# Patient Record
Sex: Male | Born: 1960 | Race: Black or African American | Hispanic: No | Marital: Married | Smoking: Never smoker
Health system: Southern US, Community
[De-identification: ages and names within clinical notes are randomized; demographics above are authoritative.]

## PROBLEM LIST (undated history)

## (undated) DIAGNOSIS — E119 Type 2 diabetes mellitus without complications: Secondary | ICD-10-CM

## (undated) HISTORY — PX: NO PAST SURGERIES: SHX2092

---

## 2006-07-05 ENCOUNTER — Other Ambulatory Visit: Payer: Self-pay

## 2006-07-21 ENCOUNTER — Ambulatory Visit: Payer: Self-pay | Admitting: Surgery

## 2007-03-27 ENCOUNTER — Ambulatory Visit: Payer: Self-pay | Admitting: Gastroenterology

## 2009-05-06 ENCOUNTER — Ambulatory Visit: Payer: Self-pay | Admitting: Family Medicine

## 2011-02-04 IMAGING — US US PELVIS LIMITED
1 series · 17 of 25 positions shown · non-contrast
Comparison: none

REASON FOR EXAM: testicular lump
COMMENTS:

[Series 1: us pelvis limited · 17 of 51 slices shown]
[im 1/51]
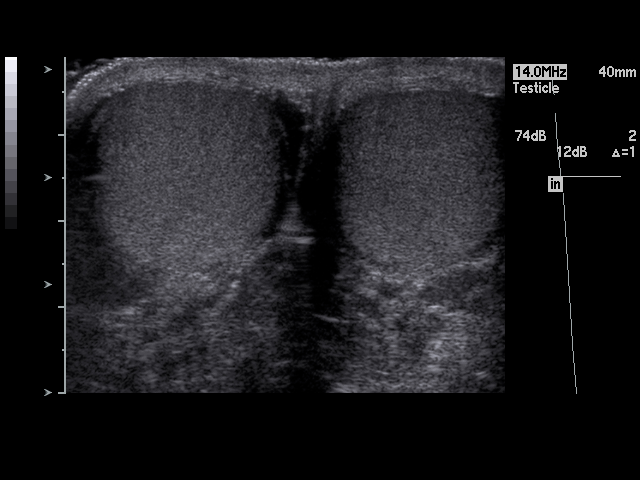
[im 5/51]
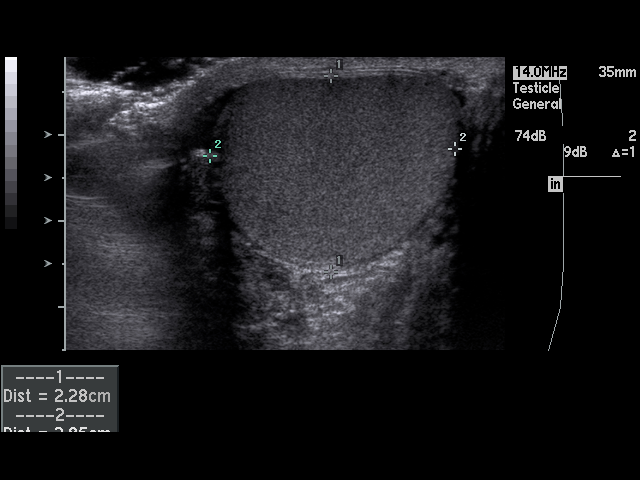
[im 7/51]
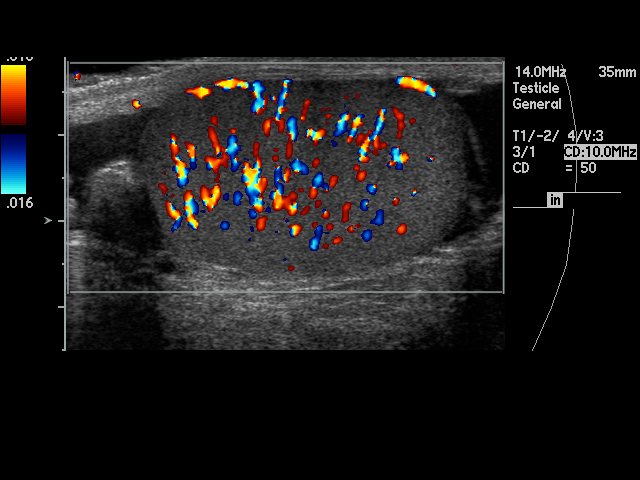
[im 11/51]
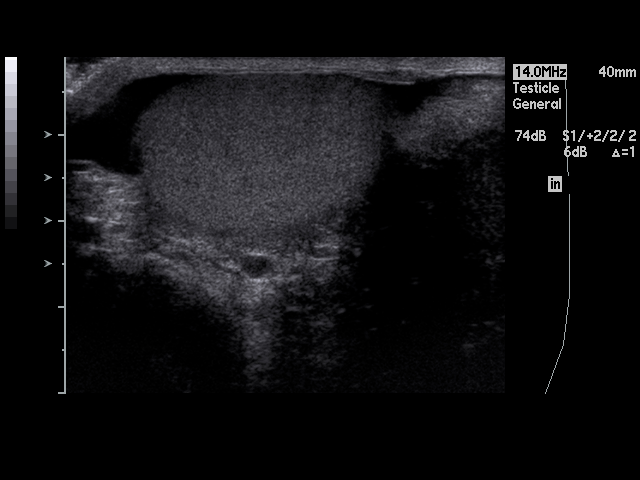
[im 13/51]
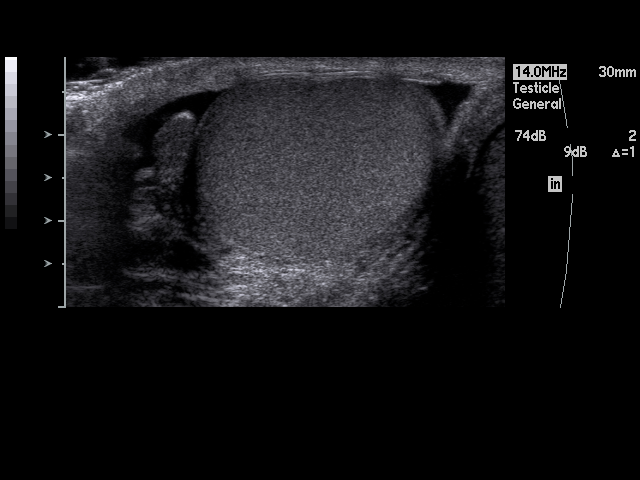
[im 17/51]
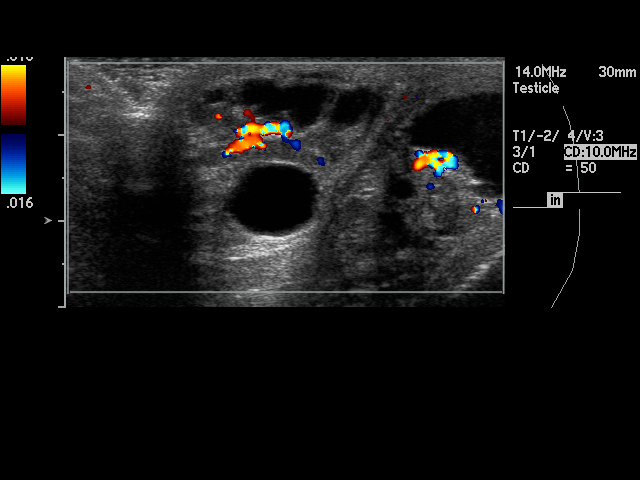
[im 19/51]
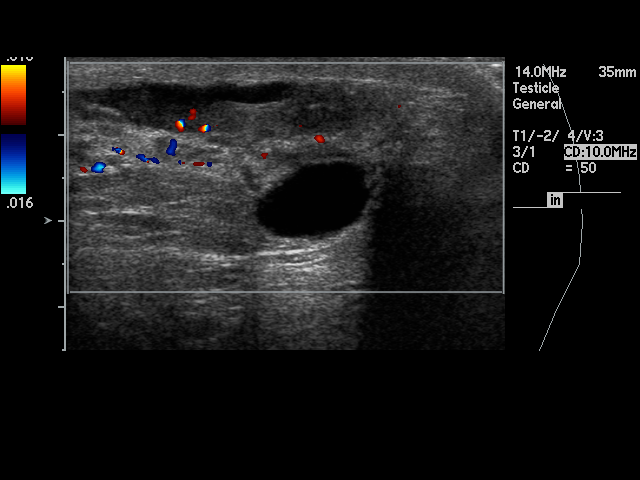
[im 23/51]
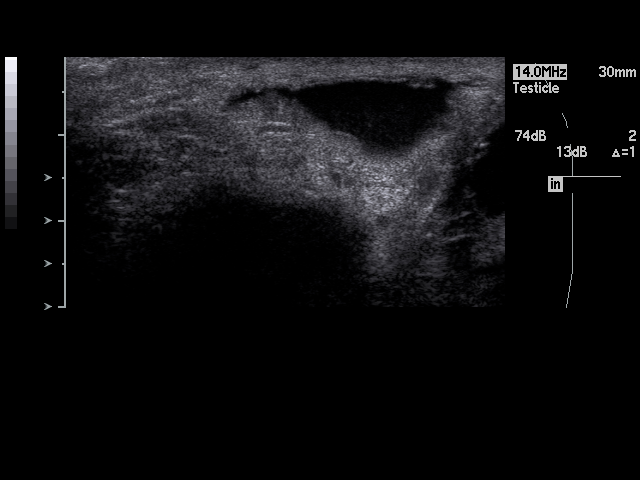
[im 26/51]
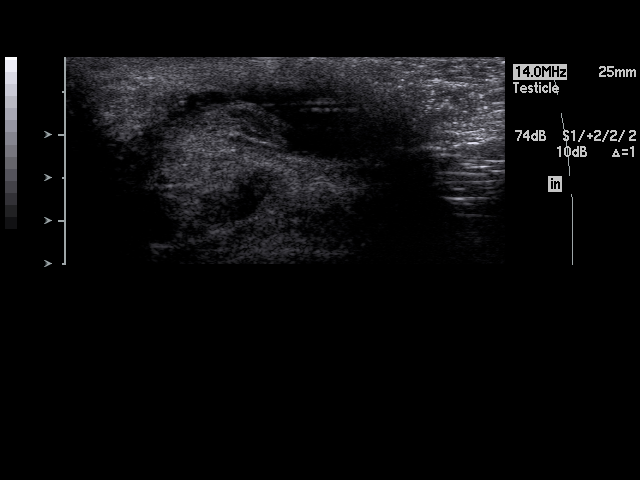
[im 28/51]
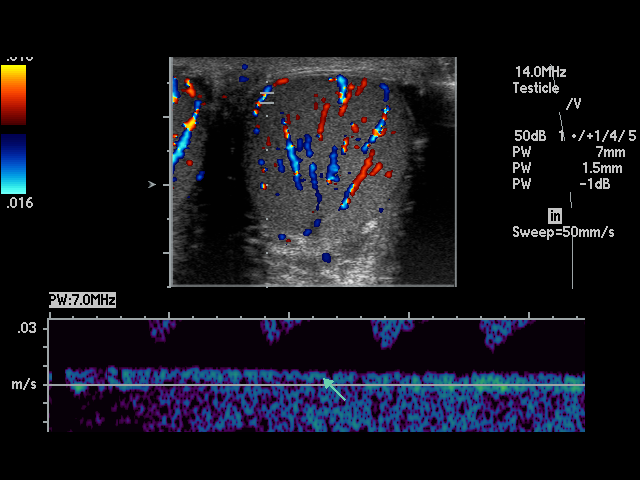
[im 32/51]
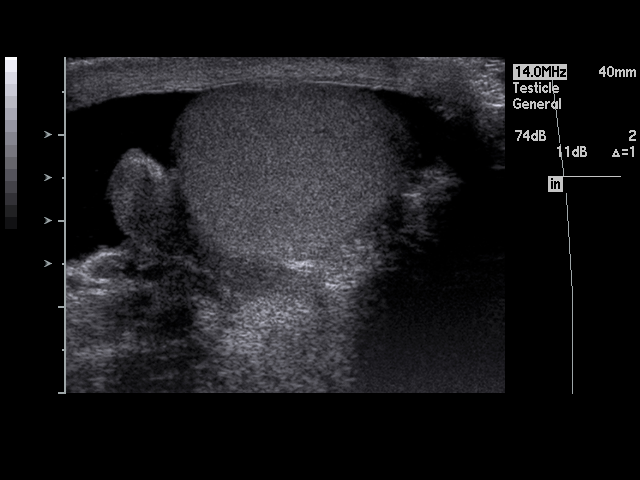
[im 34/51]
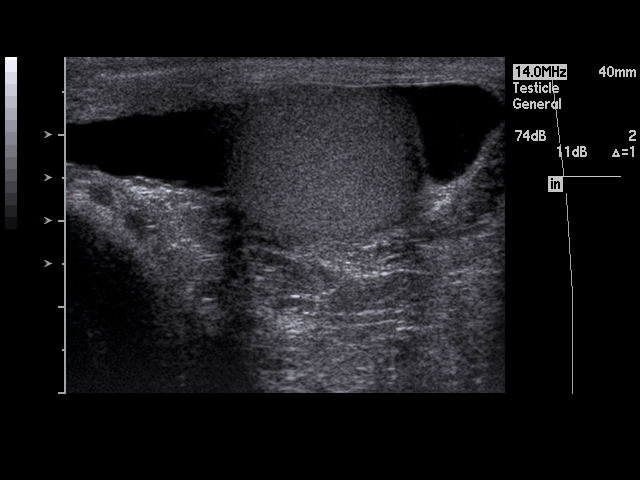
[im 38/51]
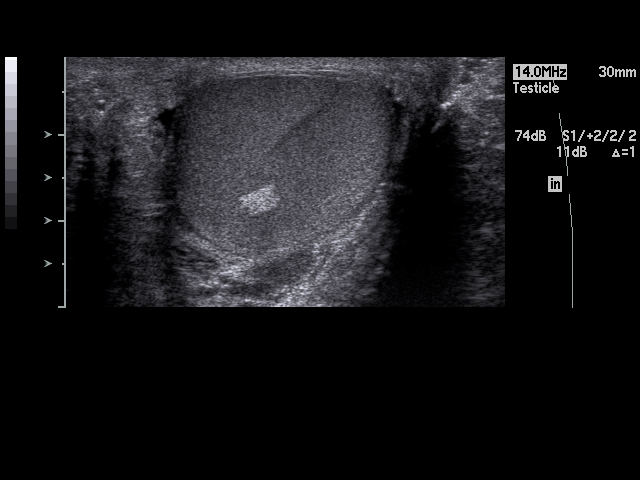
[im 40/51]
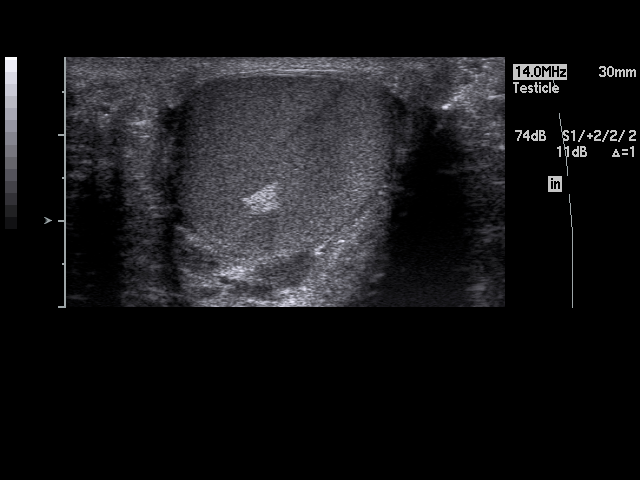
[im 44/51]
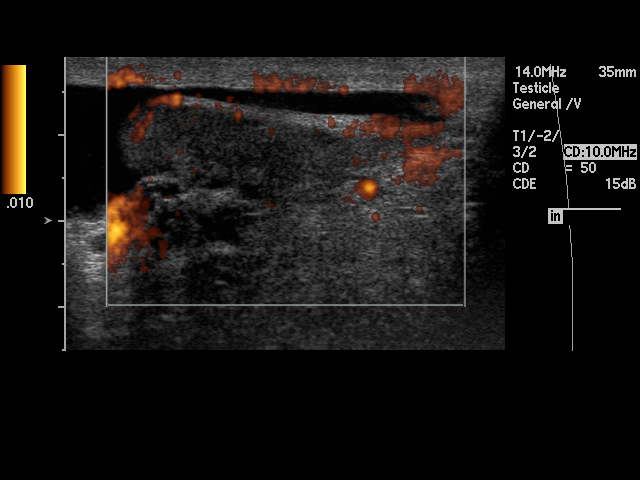
[im 46/51]
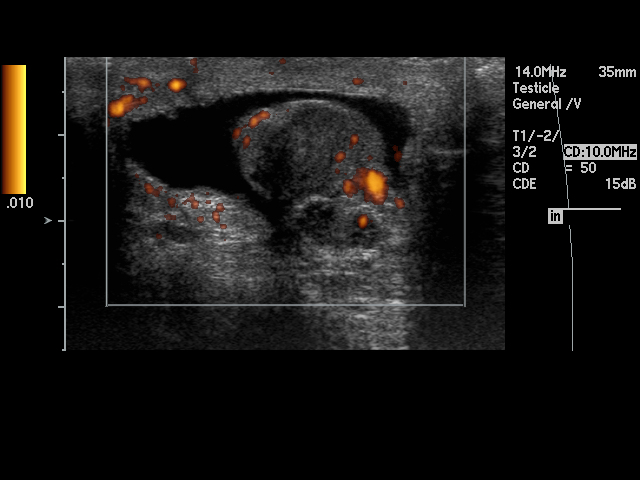
[im 51/51]
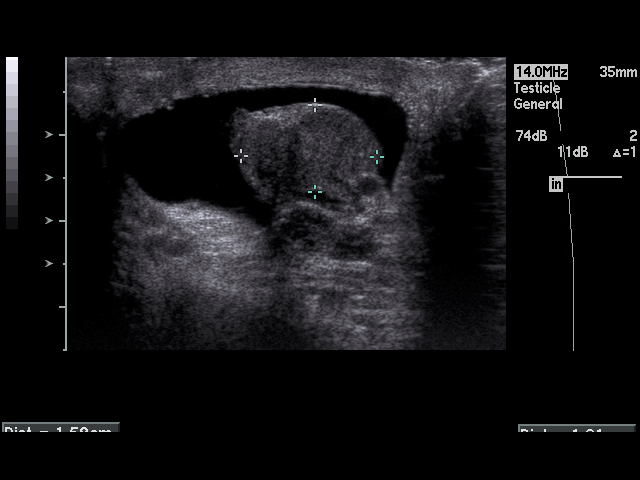

[17 of 25 positions shown; findings below may reference images not displayed]

PROCEDURE:     WEEKS - WEEKS TESTICULAR  - May 06, 2009 [DATE]

RESULT:     The right testicle measures 3.87 cm x 2.28 x 2.85 cm. The left
testicle measures 3.49 cm x 2.42 cm x 2.52 cm. Doppler examination shows
symmetrical vascular flow in each testicle. There is no evidence for
torsion. There is a 4.1 mm, hyperechoic focus in the left testicle. This is
associated with attenuation of the echo beam and is consistent with
calcification. No associated solid mass component is seen. This may
represent a calcified hemangioma, hematoma or granuloma. On the right, where
the patient is symptomatic, there is identified no intratesticular mass. On
the right, there is an extratesticular cyst that measures 1.35 cm at maximum
diameter. Small hydroceles are seen bilaterally with that on the left being
larger.
IMPRESSION: 1.  There is a nonspecific focal calcification in the left testicle.
2.  There is an extratesticular cyst on the right. The cyst measures 1.35 cm
at maximum diameter.
3.  Bilateral hydroceles are seen with that on the left being larger.
4.  No torsion is evident.

## 2011-07-30 ENCOUNTER — Emergency Department: Payer: Self-pay | Admitting: Emergency Medicine

## 2011-09-14 ENCOUNTER — Ambulatory Visit: Payer: Self-pay | Admitting: Family Medicine

## 2012-03-21 ENCOUNTER — Ambulatory Visit: Payer: Self-pay | Admitting: Internal Medicine

## 2012-04-04 ENCOUNTER — Ambulatory Visit: Payer: Self-pay | Admitting: Family Medicine

## 2012-04-18 ENCOUNTER — Ambulatory Visit: Payer: Self-pay | Admitting: Family Medicine

## 2013-10-25 DIAGNOSIS — J45909 Unspecified asthma, uncomplicated: Secondary | ICD-10-CM | POA: Insufficient documentation

## 2013-10-25 DIAGNOSIS — J309 Allergic rhinitis, unspecified: Secondary | ICD-10-CM | POA: Insufficient documentation

## 2013-10-25 DIAGNOSIS — E7800 Pure hypercholesterolemia, unspecified: Secondary | ICD-10-CM | POA: Insufficient documentation

## 2013-10-25 DIAGNOSIS — I10 Essential (primary) hypertension: Secondary | ICD-10-CM | POA: Diagnosis present

## 2013-10-25 DIAGNOSIS — A6000 Herpesviral infection of urogenital system, unspecified: Secondary | ICD-10-CM | POA: Insufficient documentation

## 2015-10-21 DIAGNOSIS — G4733 Obstructive sleep apnea (adult) (pediatric): Secondary | ICD-10-CM | POA: Diagnosis present

## 2015-10-21 DIAGNOSIS — D649 Anemia, unspecified: Secondary | ICD-10-CM | POA: Insufficient documentation

## 2016-05-30 DIAGNOSIS — D709 Neutropenia, unspecified: Secondary | ICD-10-CM | POA: Insufficient documentation

## 2017-05-10 ENCOUNTER — Ambulatory Visit (INDEPENDENT_AMBULATORY_CARE_PROVIDER_SITE_OTHER): Payer: BLUE CROSS/BLUE SHIELD

## 2017-05-10 ENCOUNTER — Other Ambulatory Visit: Payer: Self-pay | Admitting: Podiatry

## 2017-05-10 ENCOUNTER — Encounter: Payer: Self-pay | Admitting: Podiatry

## 2017-05-10 ENCOUNTER — Ambulatory Visit (INDEPENDENT_AMBULATORY_CARE_PROVIDER_SITE_OTHER): Payer: BLUE CROSS/BLUE SHIELD | Admitting: Podiatry

## 2017-05-10 VITALS — BP 138/82 | HR 73 | Temp 98.1°F | Resp 16

## 2017-05-10 DIAGNOSIS — M7751 Other enthesopathy of right foot: Secondary | ICD-10-CM

## 2017-05-10 DIAGNOSIS — S99921A Unspecified injury of right foot, initial encounter: Secondary | ICD-10-CM

## 2017-05-10 MED ORDER — DICLOFENAC SODIUM 75 MG PO TBEC: 75.0000 mg | DELAYED_RELEASE_TABLET | Freq: Two times a day (BID) | ORAL | 0 refills | Status: DC

## 2017-05-10 NOTE — Progress Notes (Signed)
   Subjective:    Patient ID: Jake Leonard, male    DOB: 06/01/1961, 56 y.o.   MRN: [id number]  HPI    Review of Systems  Eyes: Positive for itching.  Genitourinary: Positive for frequency.  All other systems reviewed and are negative.      Objective:   Physical Exam        Assessment & Plan:

## 2017-05-12 NOTE — Progress Notes (Signed)
   HPI: 56 year old male presenting today as a new patient with a complaint of sharp, shooting pain throughout the right great toe, foot and up the RLE that began approximately one week ago. He states he stubbed the toe on concrete at that time. He reports associated swelling. He has been massaging the area and soaking it in Epsom salts. He is here for further evaluation and treatment.   No past medical history on file.   Physical Exam: General: The patient is alert and oriented x3 in no acute distress.  Dermatology: Skin is warm, dry and supple bilateral lower extremities. Negative for open lesions or macerations.  Vascular: Palpable pedal pulses bilaterally. No edema or erythema noted. Capillary refill within normal limits.  Neurological: Epicritic and protective threshold grossly intact bilaterally.   Musculoskeletal Exam: Pain with palpation to the first MPJ of the right foot. Range of motion within normal limits to all pedal and ankle joints bilateral. Muscle strength 5/5 in all groups bilateral.   Radiographic Exam:  Normal osseous mineralization. Joint spaces preserved. No fracture/dislocation/boney destruction.    Assessment: - First MPJ capsulitis   Plan of Care:  - Patient evaluated. X-rays reviewed. - Prescription for diclofenac 75 mg given to patient. - Recommended good shoe gear. - Return to clinic when necessary.   Felecia ShellingBrent M. Evans, DPM [facility]  Dr. Felecia ShellingBrent M. Evans, DPM    [address]                                        [place], [place] [address]                Office [phone]  Fax [phone]

## 2018-07-18 DIAGNOSIS — E1165 Type 2 diabetes mellitus with hyperglycemia: Secondary | ICD-10-CM | POA: Diagnosis present

## 2019-03-17 ENCOUNTER — Other Ambulatory Visit: Payer: Self-pay

## 2019-03-17 ENCOUNTER — Ambulatory Visit
Admission: EM | Admit: 2019-03-17 | Discharge: 2019-03-17 | Disposition: A | Discharge status: Home / Self Care | Payer: BC Managed Care – PPO | Attending: Urgent Care | Admitting: Urgent Care

## 2019-03-17 ENCOUNTER — Ambulatory Visit (INDEPENDENT_AMBULATORY_CARE_PROVIDER_SITE_OTHER): Payer: BC Managed Care – PPO

## 2019-03-17 ENCOUNTER — Encounter: Payer: Self-pay | Admitting: Emergency Medicine

## 2019-03-17 DIAGNOSIS — Z6379 Other stressful life events affecting family and household: Secondary | ICD-10-CM

## 2019-03-17 DIAGNOSIS — R7989 Other specified abnormal findings of blood chemistry: Secondary | ICD-10-CM

## 2019-03-17 DIAGNOSIS — R079 Chest pain, unspecified: Secondary | ICD-10-CM

## 2019-03-17 DIAGNOSIS — Z634 Disappearance and death of family member: Secondary | ICD-10-CM | POA: Diagnosis not present

## 2019-03-17 DIAGNOSIS — R739 Hyperglycemia, unspecified: Secondary | ICD-10-CM | POA: Diagnosis not present

## 2019-03-17 HISTORY — DX: Type 2 diabetes mellitus without complications: E11.9

## 2019-03-17 LAB — CBC
HCT: 40.6 % (ref 39.0–52.0)
Hemoglobin: 13.7 g/dL (ref 13.0–17.0)
MCH: 28.7 pg (ref 26.0–34.0)
MCHC: 33.7 g/dL (ref 30.0–36.0)
MCV: 85.1 fL (ref 80.0–100.0)
Platelets: 280 10*3/uL (ref 150–400)
RBC: 4.77 MIL/uL (ref 4.22–5.81)
RDW: 13.3 % (ref 11.5–15.5)
WBC: 4.8 10*3/uL (ref 4.0–10.5)
nRBC: 0 % (ref 0.0–0.2)

## 2019-03-17 LAB — BASIC METABOLIC PANEL
Anion gap: 11 (ref 5–15)
BUN: 25 mg/dL — ABNORMAL HIGH (ref 6–20)
CO2: 20 mmol/L — ABNORMAL LOW (ref 22–32)
Calcium: 9 mg/dL (ref 8.9–10.3)
Chloride: 103 mmol/L (ref 98–111)
Creatinine, Ser: 1.41 mg/dL — ABNORMAL HIGH (ref 0.61–1.24)
GFR calc Af Amer: >60 mL/min (ref >60)
GFR calc non Af Amer: 55 mL/min — ABNORMAL LOW (ref >60)
Glucose, Bld: 276 mg/dL — ABNORMAL HIGH (ref 70–99)
Potassium: 4.2 mmol/L (ref 3.5–5.1)
Sodium: 134 mmol/L — ABNORMAL LOW (ref 135–145)

## 2019-03-17 LAB — TROPONIN I (HIGH SENSITIVITY): Troponin I (High Sensitivity): 4 ng/L (ref <18)

## 2019-03-17 NOTE — ED Triage Notes (Signed)
Pt c/o left sided chest pain. Pain radiated to his back. Started today.Denies shortness of breath, sweats, or nausea. Denies family or personal history of heart disease.

## 2019-03-17 NOTE — Discharge Instructions (Addendum)
It was very nice seeing you today in clinic. Thank you for entrusting me with your care.   Your heart labs and chest film were normal. Your renal function was elevated, but within your baseline. Your sugar was elevated at 276. Suspect that this pain is musculoskeletal in nature since it is improving with the Tylenol, however  cannot rule out a heart issue in the urgent care setting. I would recommended you being seen in the ED for worsening pain, or if you develop shortness of breath, nausea, sweating, or any other concerning symptom.   Make arrangements to follow up with your regular doctor in 1 week for re-evaluation if not improving. Again, if your symptoms/condition worsens, please seek follow up care either here or in the ER. Please remember, our [facility] providers are "right here with you" when you need [place].   Again, it was my pleasure to take care of you today. Thank you for choosing our clinic. I hope that you start to feel better quickly.   Honor Loh, MSN, APRN, FNP-C, CEN Advanced Practice Provider [facility]

## 2019-03-17 NOTE — ED Provider Notes (Signed)
[place], [place]   Name: Jake Leonard DOB: 02/19/1961 MRN: [id number] CSN: [id number] PCP: [facility]  Arrival date and time:  03/17/19 1132  Chief Complaint:  Chest Pain   NOTE: Prior to seeing the patient today, I have reviewed the triage nursing documentation and vital signs. Clinical staff has updated patient's PMH/PSHx, current medication list, and drug allergies/intolerances to ensure comprehensive history available to assist in medical decision making.   History:   HPI: Jake Leonard is a 58 y.o. male who presents today with complaints of LEFT sided chest pain that began with acute onset earlier today. Patient advising that he has been under a great deal of stress due to the loss of his mother this week. Patient describes the pain as having a sharp quality. Patient denies any blunt force trauma to his anterior or lateral chest wall. He notes that the pain radiates intermittently into his back, but not into the shoulder, neck, or jaw. Patient is not having any pain in his LEFT upper extremity. He denies associated shortness of breath, nausea, vomiting, and diaphoresis. Patient does not have a history of gastrointestinal reflux. Prior to the onset of his symptoms, patient notes that he was "not really doing anything". Pain is reproducible with movement, but not with palpation or deep inspiration. Patient indicates that the pain is improved by rest. Patient advising that he has experienced similar episodes of pain in his chest in the past. Patient denies a past medical history significant for any known cardiac problems. Patient does not have a history of any sort of cardiac arrhythmias; no pacemaker or AICD. He notes that his family history is also negative for MI, CAD, heart failure, VTE, and sudden cardiac death. He has never been diagnosed with a DVT or PE in the past. He is not on daily anticoagulation therapy. Patient does not take aspirin on a daily basis.    Past Medical  History:  Diagnosis Date   Diabetes mellitus without complication (HCC)     Past Surgical History:  Procedure Laterality Date   NO PAST SURGERIES      Family History  Problem Relation Age of Onset   Alzheimer's disease Mother    Alcohol abuse Father     Social History   Tobacco Use   Smoking status: Never Smoker   Smokeless tobacco: Never Used  Substance Use Topics   Alcohol use: Yes    Comment: occasionally   Drug use: No    There are no active problems to display for this patient.   Home Medications:    Current Meds  Medication Sig   glipiZIDE (GLUCOTROL) 10 MG tablet Take 10 mg by mouth daily before breakfast.   metFORMIN (GLUCOPHAGE) 1000 MG tablet Take 1,000 mg by mouth 2 (two) times daily with a meal.   simvastatin (ZOCOR) 20 MG tablet Take 20 mg by mouth daily.    Allergies:   Patient has no known allergies.  Review of Systems (ROS): Review of Systems  Constitutional: Negative for chills and fever.  Respiratory: Negative for cough and shortness of breath.   Cardiovascular: Positive for chest pain. Negative for palpitations.  Gastrointestinal: Negative for abdominal pain, diarrhea, nausea and vomiting.  Musculoskeletal: Positive for back pain.  Neurological: Negative for dizziness, syncope, weakness, numbness and headaches.  Psychiatric/Behavioral:       Increased stress related to loss of mother this week  All other systems reviewed and are negative.    Vital Signs: Today's Vitals  03/17/19 1142 03/17/19 1143  BP:  135/81  Pulse:  81  Resp:  18  Temp:  98.3 F (36.8 C)  TempSrc:  Oral  SpO2:  100%  Weight:  215 lb (97.5 kg)  Height:  6' (1.829 m)  PainSc: 9      Physical Exam: Physical Exam  Constitutional: He is oriented to person, place, and time and well-developed, well-nourished, and in no distress. No distress.  HENT:  Head: Normocephalic and atraumatic.  Nose: Nose normal.  Mouth/Throat: Oropharynx is clear and  moist.  Eyes: Pupils are equal, round, and reactive to light. Conjunctivae and EOM are normal.  Neck: Normal range of motion. Neck supple. No tracheal deviation present.  Cardiovascular: Normal rate, regular rhythm, normal heart sounds and intact distal pulses. Exam reveals no gallop and no friction rub.  No murmur heard. Pulmonary/Chest: Effort normal and breath sounds normal. No respiratory distress. He has no wheezes. He has no rales. He exhibits no tenderness.  Abdominal: Soft. Bowel sounds are normal. He exhibits no distension. There is no abdominal tenderness.  Musculoskeletal: Normal range of motion.  Lymphadenopathy:    He has no cervical adenopathy.  Neurological: He is alert and oriented to person, place, and time. Gait normal. GCS score is 15.  Skin: Skin is warm and dry. No rash noted. He is not diaphoretic.  Psychiatric: Mood, memory and judgment normal. He has a flat affect.  Nursing note and vitals reviewed.   Urgent Care Treatments / Results:   LABS: PLEASE NOTE: all labs that were ordered this encounter are listed, however only abnormal results are displayed. Labs Reviewed  BASIC METABOLIC PANEL - Abnormal; Notable for the following components:      Result Value   Sodium 134 (*)    CO2 20 (*)    Glucose, Bld 276 (*)    BUN 25 (*)    Creatinine, Ser 1.41 (*)    GFR calc non Af Amer 55 (*)    All other components within normal limits  CBC  TROPONIN I (HIGH SENSITIVITY)    URGENT CARE ECG REPORT Date: 03/17/2019 Time ECG obtained: 1146 AM Rate: 77 bpm Rhythm: normal sinus rhythm Axis (leads I and aVF): normal Intervals: normal ST segment and T wave changes: Non-specific ST wave abnormality in V2-V3 Comparison: Similar to previous tracing obtained on 07/05/2006  RADIOLOGY: Dg Chest 2 View  Result Date: 03/17/2019 CLINICAL DATA:  Left chest pain onset today extending into the left upper back. EXAM: CHEST - 2 VIEW COMPARISON:  Report from chest radiograph from  07/25/1998 firm 07/05/2006 FINDINGS: The lungs appear clear.  Cardiac and mediastinal contours normal. No pleural effusion identified. Mild thoracic spondylosis. IMPRESSION: No active cardiopulmonary disease. Electronically Signed   By: Gaylyn RongWalter  Liebkemann M.D.   On: 03/17/2019 12:24    PROCEDURES: Procedures  MEDICATIONS RECEIVED THIS VISIT: Medications - No data to display  PERTINENT CLINICAL COURSE NOTES/UPDATES:   Initial Impression / Assessment and Plan / Urgent Care Course:  Pertinent labs & imaging results that were available during my care of the patient were personally reviewed by me and considered in my medical decision making (see lab/imaging section of note for values and interpretations).  Jake Leonard is a 58 y.o. male who [facility] with complaints of Chest Pain   Patient is well appearing overall in clinic today. He does not appear to be in any acute distress. Presenting symptoms (see HPI) and exam as documented above. EKG  shows NSR at a rate of 77 bpm. There were non-specific ST changes in V2-V3; [facility] provider Gwinda Passe(Padachowski, MD); EKG similar to previous tracings. PERC (-).  Patient denies nausea, vomiting, diaphoresis, and associated shortness of breath. CBC unremarkable.  Glucose elevated at 276 mg/dL.  There was a slight degree of renal insufficiency noted;  BUN 25 and creatinine 1.41 mg/dL (baseline 1.2 -1.6 mg/dL). Hs-TnI normal. CXR unremarkable.   Patient under a great deal of stress related to the recent loss of his mother this past week. He seems to be coping effectively. Patient with nonspecific pain in his left chest with intermittent radiation to his back. Pain in his chest today could potentially be related, however with the improvement noted with APAP that was taken PTA, there is an undoubtable musculoskeletal component.  Patient advised that ACS could not be fully ruled out in urgent care setting.  He was encouraged to  present to the emergency department for further evaluation, however patient advising that he was feeling better after the APAP that he has taken. Patient declining to go to the emergency department at this time. He understands that any degree of worsening pain, or the development of any shortness of breath, nausea, or diaphoresis will necessitate him being seen for further evaluation.   Discussed follow up with primary care physician for re-evaluation to discuss chest pain episode, anxiety/stress/grief, hyperglycemia, and to follow up on renal function. I have reviewed the follow up and strict return precautions for any new or worsening symptoms. Patient is aware of symptoms that would be deemed urgent/emergent, and would thus require further evaluation either here or in the emergency department. At the time of discharge, he verbalized understanding and consent with the discharge plan as it was reviewed with him. All questions were fielded by provider and/or clinic staff prior to patient discharge.    ADDENDUM: Call placed to patient at 6:28 PM on 03/17/19. I spoke directly with Jake Leonard to reassess clinical condition. Patient advising that he has felt fine since being seen in clinic earlier today.  He denies any recurrent episodes of chest pain.  He has not experienced any shortness of breath, palpitations, nausea, or episodes of diaphoresis.  Reassurance provided.  Patient encouraged to present to the emergency department for further evaluation as previously discussed for any concerning symptoms. He verbalized understanding and consent. Patient very appreciative of follow up call from clinical provider.   Final Clinical Impressions / Urgent Care Diagnoses:   Final diagnoses:  Nonspecific chest pain  Elevated serum creatinine  Hyperglycemia  Death of family member  Stress due to illness of family member    New Prescriptions:  [place] Controlled Substance Registry consulted? Not Applicable  No  orders of the defined types were placed in this encounter.   Recommended Follow up Care:  Patient encouraged to follow up with the following provider within the specified time frame, or sooner as dictated by the severity of his symptoms. As always, he was instructed that for any urgent/emergent care needs, he should seek care either here or in the emergency department for more immediate evaluation.  Follow-up Information    Go to  [facility] EMER[facility]ENCY DEPARTMENT.   Specialty: Emergency Medicine Why: Recommend that you go for further evaluation, however you declined. You will need to be seen for any recurrent chest pain, or if you develop and shortness of breath, nausea, or sweating. Contact information: [address] [id number] [place] [id number]  [phone]       Call  [place].   Why: General reassessment of symptoms and follow up on today's labs. Contact information: [place] [address] [phone]         NOTE: This note was prepared using Dragon dictation software along with smaller phrase technology. Despite my best ability to proofread, there is the potential that transcriptional errors may still occur from this process, and are completely unintentional.    Karen Kitchens, NP 03/17/19 [phone]

## 2020-12-15 IMAGING — CR CHEST - 2 VIEW
2 series · 2 of 2 positions shown · non-contrast
Comparison: Report from chest radiograph from 07/25/1998 firm
07/05/2006

CLINICAL DATA: Left chest pain onset today extending into the left
upper back.

EXAM:
CHEST - 2 VIEW

[chest lat]
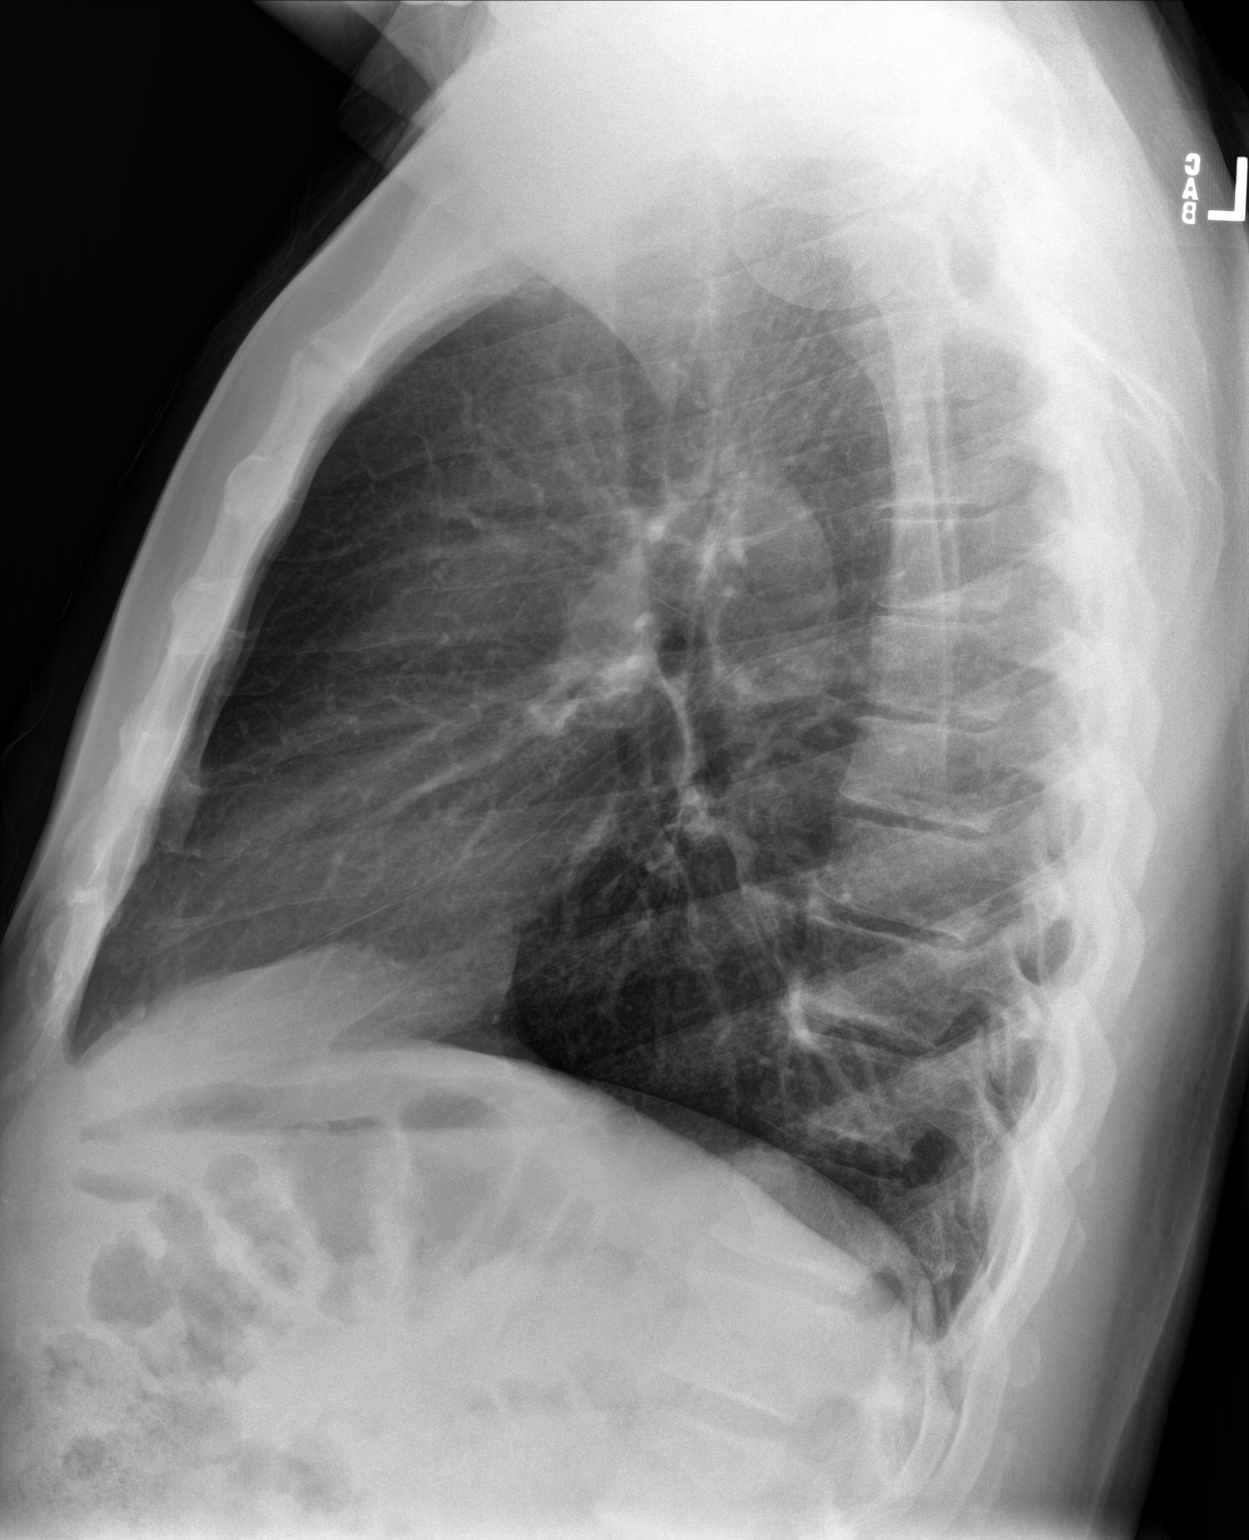

[chest pa]
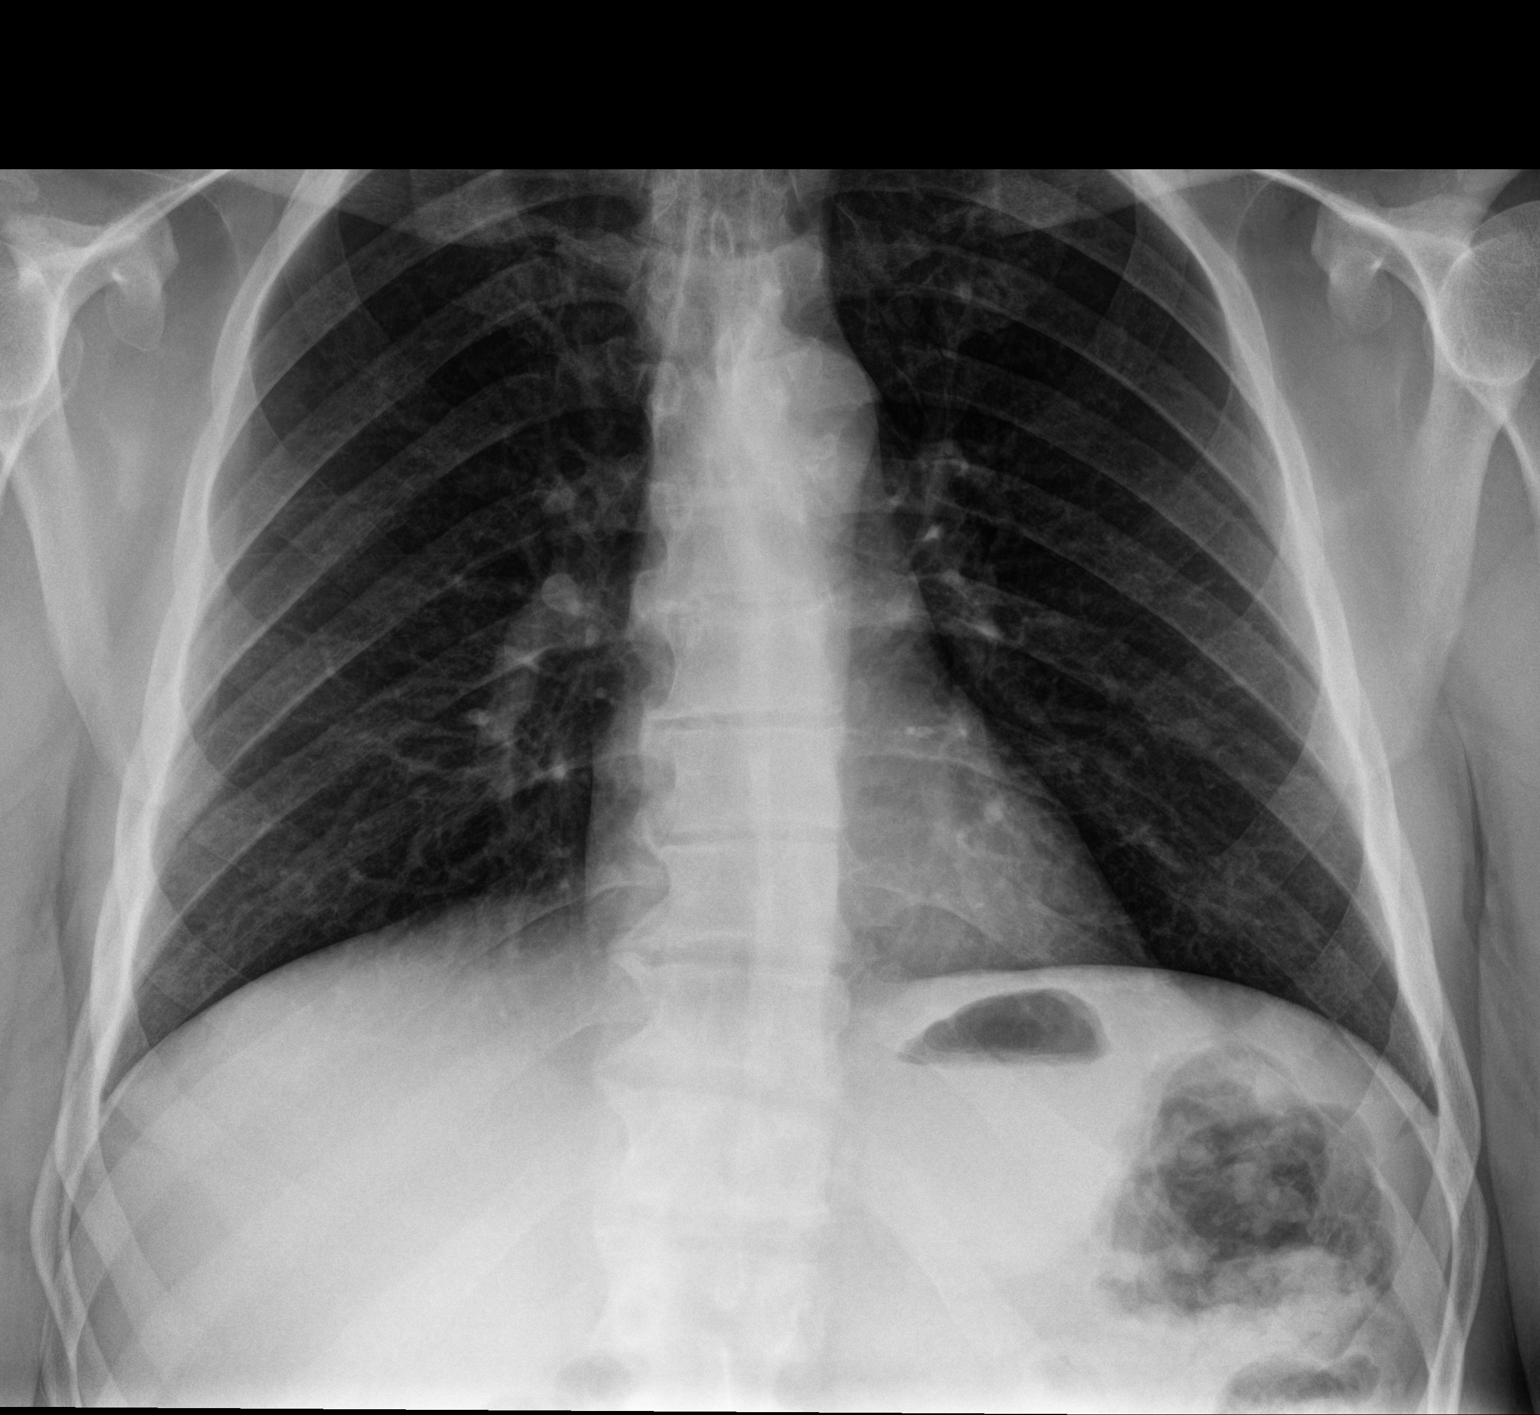

[2 of 2 positions shown; findings below may reference images not displayed]

FINDINGS: The lungs appear clear.  Cardiac and mediastinal contours normal.

No pleural effusion identified.

Mild thoracic spondylosis.
IMPRESSION: No active cardiopulmonary disease.

## 2021-02-02 ENCOUNTER — Encounter: Payer: Self-pay | Admitting: Emergency Medicine

## 2021-02-02 ENCOUNTER — Ambulatory Visit
Admission: EM | Admit: 2021-02-02 | Discharge: 2021-02-02 | Disposition: A | Discharge status: Home / Self Care | Payer: BC Managed Care – PPO | Attending: Internal Medicine | Admitting: Internal Medicine

## 2021-02-02 ENCOUNTER — Other Ambulatory Visit: Payer: Self-pay

## 2021-02-02 DIAGNOSIS — R110 Nausea: Secondary | ICD-10-CM | POA: Diagnosis not present

## 2021-02-02 DIAGNOSIS — U071 COVID-19: Secondary | ICD-10-CM

## 2021-02-02 DIAGNOSIS — R0609 Other forms of dyspnea: Secondary | ICD-10-CM

## 2021-02-02 DIAGNOSIS — R0600 Dyspnea, unspecified: Secondary | ICD-10-CM

## 2021-02-02 LAB — RESP PANEL BY RT-PCR (FLU A&B, COVID) ARPGX2
Influenza A by PCR: NEGATIVE
Influenza B by PCR: NEGATIVE
SARS Coronavirus 2 by RT PCR: POSITIVE — AB

## 2021-02-02 MED ORDER — ONDANSETRON 4 MG PO TBDP: 4.0000 mg | ORAL_TABLET | Freq: Three times a day (TID) | ORAL | 0 refills | Status: AC | PRN

## 2021-02-02 NOTE — ED Triage Notes (Signed)
Pt is present today with SOB, body aches, fever, weakness in his legs, and coughing. Pt states that his sx started one week ago

## 2021-02-02 NOTE — Discharge Instructions (Addendum)
Your Covid test is positive. You seem to be recovering from Covid infection and you are past the 5 days window to benefit from the antiviral medication. You dont seem to have a bacterial infection to need antibiotics. You will keep on getting better as the days go on. You may take Mucinex if the cough bothers you. Continue drinking sugar free electrolyte fluids for a few more days.  You EKG is normal with slight irregularity which is new compared to your old EKG from 2020. But when I listened to your heart it is not irregular. Please make sure to follow up with your primary care provider on Friday in case you are not fully better and she/he may want to ran more tests.

## 2021-02-02 NOTE — ED Provider Notes (Addendum)
[facility]    CSN: [id number] Arrival date & time: 02/02/21  6812      History   Chief Complaint Chief Complaint  Patient presents with   Shortness of Breath   Generalized Body Aches    HPI Jake Leonard is a 60 y.o. male who presents with   Had body aches and HA with fever he did not measure. Then developed a cough 6 days ago which is productive. Felt a little better 5 days ago. HA has resolved. His  body aches are mild. Last week when walking his legs felt heavy which is better today. Today also has mild nausea. Has been fatigued. Onset of SOB started x 2 days mainly with activity. Denies leg  swelling or chest pains.  His chest hurts when he coughs.  Had negative covid test 8 days ago due to being around someone who had covid but he was asymptomatic. He did not repeat another covid test. He had been sweating but has resolved.  Has been drinking Gatorade      Past Medical History:  Diagnosis Date   Diabetes mellitus without complication (HCC)     There are no problems to display for this patient.   Past Surgical History:  Procedure Laterality Date   NO PAST SURGERIES         Home Medications    Prior to Admission medications   Medication Sig Start Date End Date Taking? Authorizing Provider  ondansetron (ZOFRAN ODT) 4 MG disintegrating tablet Take 1 tablet (4 mg total) by mouth every 8 (eight) hours as needed for nausea or vomiting. 02/02/21  Yes Rodriguez-Southworth, Nettie Elm, PA-C  glipiZIDE (GLUCOTROL) 10 MG tablet Take 10 mg by mouth daily before breakfast.    [provider]  metFORMIN (GLUCOPHAGE) 1000 MG tablet Take 1,000 mg by mouth 2 (two) times daily with a meal.    [provider]  simvastatin (ZOCOR) 20 MG tablet Take 20 mg by mouth daily.    [provider]  sitaGLIPtin (JANUVIA) 50 MG tablet Take 50 mg by mouth daily.  03/17/19  [provider]    Family History Family History  Problem Relation Age  of Onset   Alzheimer's disease Mother    Alcohol abuse Father     Social History Social History   Tobacco Use   Smoking status: Never   Smokeless tobacco: Never  Vaping Use   Vaping Use: Never used  Substance Use Topics   Alcohol use: Yes    Comment: occasionally   Drug use: No     Allergies   Patient has no known allergies.   Review of Systems Review of Systems  Constitutional:  Positive for fatigue. Negative for activity change, appetite change, chills, diaphoresis and fever.  HENT:  Negative for congestion, ear discharge, ear pain, postnasal drip, rhinorrhea and trouble swallowing.   Eyes:  Negative for discharge.  Respiratory:  Positive for cough and shortness of breath. Negative for chest tightness.   Cardiovascular:  Negative for chest pain, palpitations and leg swelling.  Gastrointestinal:  Positive for nausea. Negative for abdominal pain, diarrhea and vomiting.  Musculoskeletal:  Positive for myalgias.  Skin:  Negative for rash.  Neurological:  Negative for headaches.  Hematological:  Negative for adenopathy.    Physical Exam Triage Vital Signs ED Triage Vitals  Enc Vitals Group     BP 02/02/21 0951 123/80     Pulse Rate 02/02/21 0951 74     Resp 02/02/21 0951 18  Temp 02/02/21 0951 98.2 F (36.8 C)     Temp src --      SpO2 02/02/21 0951 100 %     Weight --      Height --      Head Circumference --      Peak Flow --      Pain Score 02/02/21 0949 0     Pain Loc --      Pain Edu? --      Excl. in GC? --    No data found.  Updated Vital Signs BP 123/80   Pulse 74   Temp 98.2 F (36.8 C)   Resp 18   SpO2 100%   Visual Acuity Right Eye Distance:   Left Eye Distance:   Bilateral Distance:    Right Eye Near:   Left Eye Near:    Bilateral Near:     Physical Exam Physical Exam Vitals signs and nursing note reviewed.  Constitutional:      General: He is not in acute distress.    Appearance: Normal appearance. He is not ill-appearing,  toxic-appearing or diaphoretic.  HENT:     Head: Normocephalic.     Right Ear: Tympanic membrane, ear canal and external ear normal.     Left Ear: Tympanic membrane, ear canal and external ear normal.     Nose: Nose normal.     Mouth/Throat:     Mouth: Mucous membranes are moist.  Eyes:     General: No scleral icterus.       Right eye: No discharge.        Left eye: No discharge.     Conjunctiva/sclera: Conjunctivae normal.  Neck:     Musculoskeletal: Neck supple. No neck rigidity.  Cardiovascular:     Rate and Rhythm: Normal rate and regular rhythm.     Heart sounds: No murmur.  Pulmonary:     Effort: Pulmonary effort is normal.     Breath sounds: Normal breath sounds.  Abdominal:     General: Bowel sounds are normal. There is no distension.     Palpations: Abdomen is soft. There is no mass.     Tenderness: There is no abdominal tenderness. There is no guarding or rebound.     Hernia: No hernia is present.  Musculoskeletal: Normal range of motion and 5/5 lower extremity strength. No edema noted.   Lymphadenopathy:     Cervical: No cervical adenopathy.  Skin:    General: Skin is warm and dry.     Coloration: Skin is not jaundiced.     Findings: No rash.  Neurological:     Mental Status: He is alert and oriented to person, place, and time. +2/4 lower extremity reflexes     Gait: Gait normal.  Psychiatric:        Mood and Affect: Mood normal.        Behavior: Behavior normal.        Thought Content: Thought content normal.        Judgment: Judgment normal.  I did a pulse ox measurement while he walked and stayed around 97-98% and did not feel SOB during this walk   UC Treatments / Results  Labs (all labs ordered are listed, but only abnormal results are displayed) Labs Reviewed  RESP PANEL BY RT-PCR (FLU A&B, COVID) ARPGX2 - Abnormal; Notable for the following components:      Result Value   SARS Coronavirus 2 by RT PCR POSITIVE (*)    All  other components within  normal limits    EKG  NSR with sinus arrhythmia. Normal EKG  Radiology No results found.  Procedures Procedures (including critical care time)  Medications Ordered in UC Medications - No data to display  Initial Impression / Assessment and Plan / UC Course  I have reviewed the triage vital signs and the nursing notes. Pertinent labs & imaging results that were available during my care of the patient were reviewed by me and considered in my medical decision making (see chart for details). Has covid infection and is recovering from this. See instructions.     Final Clinical Impressions(s) / UC Diagnoses   Final diagnoses:  COVID-19 virus infection  Nausea without vomiting  Dyspnea on exertion     Discharge Instructions      Your Covid test is positive. You seem to be recovering from Covid infection and you are past the 5 days window to benefit from the antiviral medication. You dont seem to have a bacterial infection to need antibiotics. You will keep on getting better as the days go on. You may take Mucinex if the cough bothers you. Continue drinking sugar free electrolyte fluids for a few more days.  You EKG is normal with slight irregularity which is new compared to your old EKG from 2020. But when I listened to your heart it is not irregular. Please make sure to follow up with your primary care provider on Friday in case you are not fully better and she/he may want to ran more tests.      ED Prescriptions     Medication Sig Dispense Auth. Provider   ondansetron (ZOFRAN ODT) 4 MG disintegrating tablet Take 1 tablet (4 mg total) by mouth every 8 (eight) hours as needed for nausea or vomiting. 20 tablet Rodriguez-Southworth, Nettie Elm, PA-C      PDMP not reviewed this encounter.   Garey Ham, PA-C 02/02/21 1047    Rodriguez-Southworth, [place], PA-C 02/02/21 1049

## 2022-09-03 ENCOUNTER — Ambulatory Visit: Payer: Self-pay

## 2022-09-03 ENCOUNTER — Emergency Department: Payer: PRIVATE HEALTH INSURANCE

## 2022-09-03 ENCOUNTER — Other Ambulatory Visit: Payer: Self-pay

## 2022-09-03 ENCOUNTER — Inpatient Hospital Stay
Admission: EM | Admit: 2022-09-03 | Discharge: 2022-09-06 | DRG: 638 | Disposition: A | Discharge status: Home / Self Care | Payer: PRIVATE HEALTH INSURANCE | Attending: Internal Medicine | Admitting: Internal Medicine

## 2022-09-03 ENCOUNTER — Ambulatory Visit
Admission: EM | Admit: 2022-09-03 | Discharge: 2022-09-03 | Disposition: A | Discharge status: Home / Self Care | Payer: Self-pay | Attending: Emergency Medicine | Admitting: Emergency Medicine

## 2022-09-03 DIAGNOSIS — M79605 Pain in left leg: Secondary | ICD-10-CM | POA: Diagnosis present

## 2022-09-03 DIAGNOSIS — I10 Essential (primary) hypertension: Secondary | ICD-10-CM | POA: Diagnosis present

## 2022-09-03 DIAGNOSIS — R739 Hyperglycemia, unspecified: Secondary | ICD-10-CM

## 2022-09-03 DIAGNOSIS — E1165 Type 2 diabetes mellitus with hyperglycemia: Secondary | ICD-10-CM | POA: Diagnosis not present

## 2022-09-03 DIAGNOSIS — Z7984 Long term (current) use of oral hypoglycemic drugs: Secondary | ICD-10-CM | POA: Diagnosis not present

## 2022-09-03 DIAGNOSIS — R809 Proteinuria, unspecified: Secondary | ICD-10-CM | POA: Diagnosis present

## 2022-09-03 DIAGNOSIS — T383X6A Underdosing of insulin and oral hypoglycemic [antidiabetic] drugs, initial encounter: Secondary | ICD-10-CM | POA: Diagnosis present

## 2022-09-03 DIAGNOSIS — W19XXXA Unspecified fall, initial encounter: Secondary | ICD-10-CM | POA: Diagnosis present

## 2022-09-03 DIAGNOSIS — Z811 Family history of alcohol abuse and dependence: Secondary | ICD-10-CM

## 2022-09-03 DIAGNOSIS — I959 Hypotension, unspecified: Secondary | ICD-10-CM | POA: Diagnosis not present

## 2022-09-03 DIAGNOSIS — E785 Hyperlipidemia, unspecified: Secondary | ICD-10-CM | POA: Diagnosis present

## 2022-09-03 DIAGNOSIS — M79604 Pain in right leg: Secondary | ICD-10-CM | POA: Diagnosis present

## 2022-09-03 DIAGNOSIS — Z820 Family history of epilepsy and other diseases of the nervous system: Secondary | ICD-10-CM | POA: Diagnosis not present

## 2022-09-03 DIAGNOSIS — Z91148 Patient's other noncompliance with medication regimen for other reason: Secondary | ICD-10-CM | POA: Diagnosis not present

## 2022-09-03 DIAGNOSIS — G4733 Obstructive sleep apnea (adult) (pediatric): Secondary | ICD-10-CM | POA: Diagnosis not present

## 2022-09-03 DIAGNOSIS — N179 Acute kidney failure, unspecified: Secondary | ICD-10-CM | POA: Diagnosis present

## 2022-09-03 DIAGNOSIS — Z79899 Other long term (current) drug therapy: Secondary | ICD-10-CM

## 2022-09-03 DIAGNOSIS — M79606 Pain in leg, unspecified: Secondary | ICD-10-CM | POA: Diagnosis present

## 2022-09-03 DIAGNOSIS — E1110 Type 2 diabetes mellitus with ketoacidosis without coma: Principal | ICD-10-CM | POA: Diagnosis present

## 2022-09-03 LAB — URINALYSIS, ROUTINE W REFLEX MICROSCOPIC
Bacteria, UA: NONE SEEN
Bilirubin Urine: NEGATIVE
Glucose, UA: >=500 mg/dL — AB
Ketones, ur: 80 mg/dL — AB
Leukocytes,Ua: NEGATIVE
Nitrite: NEGATIVE
Protein, ur: 30 mg/dL — AB
Specific Gravity, Urine: 1.026 (ref 1.005–1.030)
pH: 5 (ref 5.0–8.0)

## 2022-09-03 LAB — BASIC METABOLIC PANEL
Anion gap: 20 — ABNORMAL HIGH (ref 5–15)
Anion gap: 16 — ABNORMAL HIGH (ref 5–15)
BUN: 27 mg/dL — ABNORMAL HIGH (ref 8–23)
BUN: 25 mg/dL — ABNORMAL HIGH (ref 8–23)
BUN: 21 mg/dL (ref 8–23)
CO2: 9 mmol/L — ABNORMAL LOW (ref 22–32)
CO2: <7 mmol/L — ABNORMAL LOW (ref 22–32)
CO2: 12 mmol/L — ABNORMAL LOW (ref 22–32)
Calcium: 9.6 mg/dL (ref 8.9–10.3)
Calcium: 9.3 mg/dL (ref 8.9–10.3)
Calcium: 9 mg/dL (ref 8.9–10.3)
Chloride: 100 mmol/L (ref 98–111)
Chloride: 105 mmol/L (ref 98–111)
Chloride: 105 mmol/L (ref 98–111)
Creatinine, Ser: 1.84 mg/dL — ABNORMAL HIGH (ref 0.61–1.24)
Creatinine, Ser: 1.72 mg/dL — ABNORMAL HIGH (ref 0.61–1.24)
Creatinine, Ser: 1.48 mg/dL — ABNORMAL HIGH (ref 0.61–1.24)
GFR, Estimated: 41 mL/min — ABNORMAL LOW (ref >60)
GFR, Estimated: 45 mL/min — ABNORMAL LOW (ref >60)
GFR, Estimated: 53 mL/min — ABNORMAL LOW (ref >60)
Glucose, Bld: 485 mg/dL — ABNORMAL HIGH (ref 70–99)
Glucose, Bld: 300 mg/dL — ABNORMAL HIGH (ref 70–99)
Glucose, Bld: 199 mg/dL — ABNORMAL HIGH (ref 70–99)
Potassium: 5.1 mmol/L (ref 3.5–5.1)
Potassium: 4.6 mmol/L (ref 3.5–5.1)
Potassium: 3.8 mmol/L (ref 3.5–5.1)
Sodium: 129 mmol/L — ABNORMAL LOW (ref 135–145)
Sodium: 134 mmol/L — ABNORMAL LOW (ref 135–145)
Sodium: 133 mmol/L — ABNORMAL LOW (ref 135–145)

## 2022-09-03 LAB — GLUCOSE, CAPILLARY
Glucose-Capillary: 151 mg/dL — ABNORMAL HIGH (ref 70–99)
Glucose-Capillary: 163 mg/dL — ABNORMAL HIGH (ref 70–99)
Glucose-Capillary: 170 mg/dL — ABNORMAL HIGH (ref 70–99)
Glucose-Capillary: 171 mg/dL — ABNORMAL HIGH (ref 70–99)
Glucose-Capillary: 182 mg/dL — ABNORMAL HIGH (ref 70–99)
Glucose-Capillary: 186 mg/dL — ABNORMAL HIGH (ref 70–99)
Glucose-Capillary: 191 mg/dL — ABNORMAL HIGH (ref 70–99)
Glucose-Capillary: 196 mg/dL — ABNORMAL HIGH (ref 70–99)
Glucose-Capillary: 229 mg/dL — ABNORMAL HIGH (ref 70–99)
Glucose-Capillary: 353 mg/dL — ABNORMAL HIGH (ref 70–99)
Glucose-Capillary: 448 mg/dL — ABNORMAL HIGH (ref 70–99)

## 2022-09-03 LAB — BLOOD GAS, VENOUS
Acid-base deficit: 21.6 mmol/L — ABNORMAL HIGH (ref 0.0–2.0)
Acid-base deficit: 14.6 mmol/L — ABNORMAL HIGH (ref 0.0–2.0)
Bicarbonate: 7.4 mmol/L — ABNORMAL LOW (ref 20.0–28.0)
Bicarbonate: 12.7 mmol/L — ABNORMAL LOW (ref 20.0–28.0)
O2 Saturation: 73.2 %
O2 Saturation: 41.4 %
Patient temperature: 37
Patient temperature: 37
pCO2, Ven: 26 mmHg — ABNORMAL LOW (ref 44–60)
pCO2, Ven: 34 mmHg — ABNORMAL LOW (ref 44–60)
pH, Ven: 7.06 — CL (ref 7.25–7.43)
pH, Ven: 7.18 — CL (ref 7.25–7.43)
pO2, Ven: 46 mmHg — ABNORMAL HIGH (ref 32–45)
pO2, Ven: <31 mmHg — CL (ref 32–45)

## 2022-09-03 LAB — CBC
HCT: 48.3 % (ref 39.0–52.0)
Hemoglobin: 16.5 g/dL (ref 13.0–17.0)
MCH: 27.8 pg (ref 26.0–34.0)
MCHC: 34.2 g/dL (ref 30.0–36.0)
MCV: 81.5 fL (ref 80.0–100.0)
Platelets: 258 10*3/uL (ref 150–400)
RBC: 5.93 MIL/uL — ABNORMAL HIGH (ref 4.22–5.81)
RDW: 12.7 % (ref 11.5–15.5)
WBC: 8.6 10*3/uL (ref 4.0–10.5)
nRBC: 0 % (ref 0.0–0.2)

## 2022-09-03 LAB — TROPONIN I (HIGH SENSITIVITY)
Troponin I (High Sensitivity): 7 ng/L (ref <18)
Troponin I (High Sensitivity): 10 ng/L (ref <18)

## 2022-09-03 LAB — CBG MONITORING, ED
Glucose-Capillary: 447 mg/dL — ABNORMAL HIGH (ref 70–99)
Glucose-Capillary: 424 mg/dL — ABNORMAL HIGH (ref 70–99)
Glucose-Capillary: 390 mg/dL — ABNORMAL HIGH (ref 70–99)

## 2022-09-03 LAB — MRSA NEXT GEN BY PCR, NASAL: MRSA by PCR Next Gen: NOT DETECTED

## 2022-09-03 LAB — BETA-HYDROXYBUTYRIC ACID
Beta-Hydroxybutyric Acid: >8 mmol/L — ABNORMAL HIGH (ref 0.05–0.27)
Beta-Hydroxybutyric Acid: 6.67 mmol/L — ABNORMAL HIGH (ref 0.05–0.27)
Beta-Hydroxybutyric Acid: 3.01 mmol/L — ABNORMAL HIGH (ref 0.05–0.27)

## 2022-09-03 LAB — LACTIC ACID, PLASMA: Lactic Acid, Venous: 2.1 mmol/L (ref 0.5–1.9)

## 2022-09-03 MED ORDER — SODIUM CHLORIDE 0.9 % IV BOLUS
1000.0000 mL | Freq: Once | INTRAVENOUS | Status: AC
  Administered 2022-09-03: 1000 mL via INTRAVENOUS

## 2022-09-03 MED ORDER — LACTATED RINGERS IV BOLUS
1000.0000 mL | Freq: Once | INTRAVENOUS | Status: AC
  Administered 2022-09-03: 1000 mL via INTRAVENOUS

## 2022-09-03 MED ORDER — POTASSIUM CHLORIDE 10 MEQ/100ML IV SOLN
10.0000 meq | INTRAVENOUS | Status: AC
  Administered 2022-09-03 – 2022-09-04 (×4): 10 meq via INTRAVENOUS
  Filled 2022-09-03 (×4): qty 100

## 2022-09-03 MED ORDER — HYDROCODONE-ACETAMINOPHEN 5-325 MG PO TABS: 1.0000 | ORAL_TABLET | Freq: Four times a day (QID) | ORAL | Status: DC | PRN

## 2022-09-03 MED ORDER — LACTATED RINGERS IV SOLN: INTRAVENOUS | Status: DC

## 2022-09-03 MED ORDER — CHLORHEXIDINE GLUCONATE CLOTH 2 % EX PADS
6.0000 | MEDICATED_PAD | Freq: Every day | CUTANEOUS | Status: DC
  Administered 2022-09-03: 6 via TOPICAL

## 2022-09-03 MED ORDER — INSULIN REGULAR(HUMAN) IN NACL 100-0.9 UT/100ML-% IV SOLN
INTRAVENOUS | Status: DC
  Administered 2022-09-03: 12 [IU]/h via INTRAVENOUS
  Filled 2022-09-03: qty 100

## 2022-09-03 MED ORDER — DEXTROSE 50 % IV SOLN: 0.0000 mL | INTRAVENOUS | Status: DC | PRN

## 2022-09-03 MED ORDER — ENOXAPARIN SODIUM 40 MG/0.4ML IJ SOSY
40.0000 mg | PREFILLED_SYRINGE | INTRAMUSCULAR | Status: DC
  Administered 2022-09-03 – 2022-09-05 (×3): 40 mg via SUBCUTANEOUS
  Filled 2022-09-03 (×3): qty 0.4

## 2022-09-03 MED ORDER — ORAL CARE MOUTH RINSE: 15.0000 mL | OROMUCOSAL | Status: DC | PRN

## 2022-09-03 MED ORDER — CHLORHEXIDINE GLUCONATE CLOTH 2 % EX PADS
6.0000 | MEDICATED_PAD | Freq: Every day | CUTANEOUS | Status: DC
  Administered 2022-09-04: 6 via TOPICAL

## 2022-09-03 MED ORDER — DEXTROSE IN LACTATED RINGERS 5 % IV SOLN: INTRAVENOUS | Status: DC

## 2022-09-03 MED ORDER — ACETAMINOPHEN 325 MG PO TABS: 650.0000 mg | ORAL_TABLET | Freq: Four times a day (QID) | ORAL | Status: DC | PRN

## 2022-09-03 MED ORDER — ONDANSETRON HCL 4 MG/2ML IJ SOLN: 4.0000 mg | Freq: Four times a day (QID) | INTRAMUSCULAR | Status: DC | PRN

## 2022-09-03 NOTE — Assessment & Plan Note (Addendum)
-   Hold home lisinopril in the setting of AKI.  Blood pressure is stable

## 2022-09-03 NOTE — ED Notes (Signed)
Repeat EKG done per MD

## 2022-09-03 NOTE — Plan of Care (Signed)
  Problem: Education: Goal: Ability to describe self-care measures that may prevent or decrease complications (Diabetes Survival Skills Education) will improve Outcome: Progressing   Problem: Fluid Volume: Goal: Ability to maintain a balanced intake and output will improve Outcome: Progressing   Problem: Skin Integrity: Goal: Risk for impaired skin integrity will decrease Outcome: Progressing   Problem: Fluid Volume: Goal: Ability to achieve a balanced intake and output will improve Outcome: Progressing   Problem: Urinary Elimination: Goal: Ability to achieve and maintain adequate renal perfusion and functioning will improve Outcome: Progressing   Problem: Coping: Goal: Level of anxiety will decrease Outcome: Progressing   Problem: Elimination: Goal: Will not experience complications related to bowel motility Outcome: Progressing Goal: Will not experience complications related to urinary retention Outcome: Progressing   Problem: Pain Managment: Goal: General experience of comfort will improve Outcome: Progressing   Problem: Safety: Goal: Ability to remain free from injury will improve Outcome: Progressing   Problem: Skin Integrity: Goal: Risk for impaired skin integrity will decrease Outcome: Progressing

## 2022-09-03 NOTE — Inpatient Diabetes Management (Signed)
Inpatient Diabetes Program Recommendations  AACE/ADA: New Consensus Statement on Inpatient Glycemic Control (2015)  Target Ranges:  Prepandial:   less than 140 mg/dL      Peak postprandial:   less than 180 mg/dL (1-2 hours)      Critically ill patients:  140 - 180 mg/dL   Lab Results  Component Value Date   GLUCAP 353 (H) 09/03/2022    Latest Reference Range & Units 09/03/22 11:32  CO2 22 - 32 mmol/L 9 (L)  Glucose 70 - 99 mg/dL 485 (H)  BUN 8 - 23 mg/dL 27 (H)  Creatinine 0.61 - 1.24 mg/dL 1.84 (H)  Calcium 8.9 - 10.3 mg/dL 9.6  Anion gap 5 - 15  20 (H)  GFR, Estimated >60 mL/min 41 (L)  (L): Data is abnormally low (H): Data is abnormally high  Diabetes history: DM2 Outpatient Diabetes medications: Glucotrol 10 mg qd, Metformin 1 gm bid Current orders for Inpatient glycemic control: IV insulin  Inpatient Diabetes Program Recommendations:   Noted patient admitted in DKA and on IV insulin drip. A1c 11.8 on 04/27/22.  Will follow while inpatient.  Thank you, Nani Gasser. Akeria Hedstrom, RN, MSN, CDE  Diabetes Coordinator Inpatient Glycemic Control Team Team Pager [phone] (8am-5pm) 09/03/2022 2:50 PM

## 2022-09-03 NOTE — Assessment & Plan Note (Signed)
-   CPAP at bedtime 

## 2022-09-03 NOTE — ED Notes (Signed)
Patient is being discharged from the Urgent Care and sent to the Emergency Department via private vehicle with spouse . Per Lowella Petties, NP, patient is in need of higher level of care due to Hyperglycemia 448 and dizziness and fall. Patient and wife is aware and verbalizes understanding of plan of care.  Vitals:   09/03/22 1042  BP: 120/86  Pulse: (!) 116  Temp: 98.1 F (36.7 C)  SpO2: 97%

## 2022-09-03 NOTE — H&P (Addendum)
History and Physical    Patient: Jake Leonard [id number] DOB: 1960/07/24 DOA: 09/03/2022 DOS: the patient was seen and examined on 09/03/2022 PCP: Bubba Camp, [place]  Patient coming from: Home  Chief Complaint:  Chief Complaint  Patient presents with   Hyperglycemia   HPI: Jake Leonard is a 62 y.o. male with medical history significant of type 2 diabetes, hypertension, hyperlipidemia, who presents to the ED due to elevated blood sugar and leg pain.  Jake Leonard states that approximately 1 week ago, he ran out of his diabetes medicine and due to a recent change in insurance, he was not able to pick up his new prescriptions.  He has noticed through the week that his sugar has gradually risen.  Approximately 2 days ago, he developed shortness of breath and generalized weakness.  He was still able to walk and work as usual though.  Yesterday, he was at work and was squatting down when he became very weak and fell backwards onto his buttocks.  Since that time, he has been having buttock pain that radiates through his lateral thighs down to the superior aspect of the knees.  He states that the pain is equal in each leg but does not shoot down to his foot though.  Due to this pain, he came to the hospital.  ED course: On arrival to the ED, patient was normotensive at 120/86 with heart rate of 116.  He was saturating at 96% on room air.  Initial workup remarkable for sodium of 129, glucose of 485, potassium of 5.1, bicarb of 9, BUN 27 and creatinine of 1.86 with GFR of 41.  Anion gap elevated at 20.  CBC with normal WBC of 8.6.  Beta hydroxy elevated above 8.  Urinalysis demonstrates ketones and proteinuria.  Chest x-ray with no acute cardiopulmonary disease.  Due to DKA patient was started on insulin infusion and [facility] contacted for admission.  Review of Systems: As mentioned in the history of present illness. All other systems reviewed and are negative.  Past Medical History:  Diagnosis  Date   Diabetes mellitus without complication ([place])    Past Surgical History:  Procedure Laterality Date   NO PAST SURGERIES     Social History:  reports that he has never smoked. He has never used smokeless tobacco. He reports current alcohol use. He reports that he does not use drugs.  No Known Allergies  Family History  Problem Relation Age of Onset   Alzheimer's disease Mother    Alcohol abuse Father     Prior to Admission medications   Medication Sig Start Date End Date Taking? Authorizing Provider  glipiZIDE (GLUCOTROL) 10 MG tablet Take 10 mg by mouth daily before breakfast.    [provider]  metFORMIN (GLUCOPHAGE) 1000 MG tablet Take 1,000 mg by mouth 2 (two) times daily with a meal.    [provider]  ondansetron (ZOFRAN ODT) 4 MG disintegrating tablet Take 1 tablet (4 mg total) by mouth every 8 (eight) hours as needed for nausea or vomiting. 02/02/21   Rodriguez-Southworth, Sunday Spillers, PA-C  simvastatin (ZOCOR) 20 MG tablet Take 20 mg by mouth daily.    [provider]  sitaGLIPtin (JANUVIA) 50 MG tablet Take 50 mg by mouth daily.  03/17/19  [provider]    Physical Exam: Vitals:   09/03/22 1129 09/03/22 1423 09/03/22 1500  BP: (!) 148/92 (!) 159/95   Pulse: (!) 112 84   Resp: 18 14   Temp: 98.4 F (  36.9 C)  98.1 F (36.7 C)  TempSrc: Oral  Oral  SpO2: 99% 100%   Weight:   76 kg  Height:   6' 1"$  (1.854 m)   Physical Exam Vitals and nursing note reviewed.  Constitutional:      General: He is not in acute distress.    Appearance: He is normal weight. He is not toxic-appearing.  HENT:     Head: Normocephalic and atraumatic.     Mouth/Throat:     Mouth: Mucous membranes are dry.     Pharynx: Oropharynx is clear.  Eyes:     Conjunctiva/sclera: Conjunctivae normal.     Pupils: Pupils are equal, round, and reactive to light.  Cardiovascular:     Rate and Rhythm: Regular rhythm. Tachycardia present.     Heart sounds: No  murmur heard.    No gallop.  Pulmonary:     Effort: Pulmonary effort is normal. No respiratory distress.     Breath sounds: Normal breath sounds. No wheezing, rhonchi or rales.  Abdominal:     General: Bowel sounds are normal. There is no distension.     Palpations: Abdomen is soft.     Tenderness: There is no abdominal tenderness. There is no guarding.  Musculoskeletal:     Right lower leg: No edema.     Left lower leg: No edema.     Comments: Tenderness to palpation on the lateral aspect of the thigh and lower back.  Skin:    General: Skin is warm and dry.  Neurological:     General: No focal deficit present.     Mental Status: He is alert and oriented to person, place, and time. Mental status is at baseline.     Comments:  Patient with generalized weakness throughout, 4/5 but no focal weakness noted.  Psychiatric:        Mood and Affect: Mood normal.        Behavior: Behavior normal.    Data Reviewed: CBC with WBC of 8.6, hemoglobin of 16.5, platelets of 258 BMP with sodium of 129, potassium 5.1, bicarb 9, glucose 45, BUN 27, creatinine 1.84 and GFR of 41.  Anion gap elevated at 20 Troponin negative at 7 Beta hydroxybutyrate acid elevated above 8 Urinalysis with hematuria, ketonuria and proteinuria but no bacteria VBG with pH of 7.06 and pCO2 of 26  EKG personally reviewed.  Sinus rhythm with rate of 106.  Some motion artifact noted.  ST elevation in lead V3 only, given lack of presence and any other late, likely motion related.  DG Chest 2 View  Result Date: 09/03/2022 CLINICAL DATA:  Weakness. EXAM: CHEST - 2 VIEW COMPARISON:  Chest x-ray dated March 17, 2019. FINDINGS: The heart size and mediastinal contours are within normal limits. Both lungs are clear. The visualized skeletal structures are unremarkable. IMPRESSION: No active cardiopulmonary disease. Electronically Signed   By: Titus Dubin M.D.   On: 09/03/2022 13:10    Results are pending, will review when  available.  Assessment and Plan:  * DKA (diabetic ketoacidosis) ([place]) Patient presenting with elevated glucose, anion gap acidosis and elevated beta hydroxybutyrate acid in the setting of missed medications for 1 week due to insurance changes.  - DKA protocol via control - Maintenance LR at 200 cc until CBG below 250.  Then switch to D5-LR at 125 cc - Transition to subcutaneous insulin once anion gap has normalized x 2 - N.p.o. - BMP every 4 hours and BHA every 8 hours -  Repeat VBG in 4 hours - No indication at this time for bicarb - No negation at this time for potassium replacement - A1c pending  AKI (acute kidney injury) ([place]) Likely prerenal in nature given DKA.  - IV hydration - Follow renal function - Monitor urine output - Avoid nephrotoxic agents  Leg pain Patient states he has bilateral, lateral thigh pain that seems to originate in his lower back after he fell onto his buttocks from a squatting position.  Likely muscular in nature however differential also includes radiculopathy.  -Supportive management at this time.  OSA (obstructive sleep apnea) - CPAP at bedtime  HTN (hypertension) - Hold home lisinopril in the setting of AKI  Advance Care Planning:   Code Status: Full Code.  Patient states that his CODE STATUS should be verified by his wife.  When I asked him what he thinks she would want for him, he states that she would want him to stay around and to be full code.  Patient is alert and oriented and able to make medical decisions.  Given our discussion, will remain as full code  Consults: None  Family Communication: No family at bedside  Severity of Illness: The appropriate patient status for this patient is INPATIENT. Inpatient status is judged to be reasonable and necessary in order to provide the required intensity of service to ensure the patient's safety. The patient's presenting symptoms, physical exam findings, and initial radiographic and laboratory  data in the context of their chronic comorbidities is felt to place them at high risk for further clinical deterioration. Furthermore, it is not anticipated that the patient will be medically stable for discharge from the hospital within 2 midnights of admission.   * I certify that at the point of admission it is my clinical judgment that the patient will require inpatient hospital care spanning beyond 2 midnights from the point of admission due to high intensity of service, high risk for further deterioration and high frequency of surveillance required.*  Author: Jose Persia, MD 09/03/2022 3:41 PM  For on call review [email or web address].

## 2022-09-03 NOTE — Assessment & Plan Note (Signed)
Likely prerenal in nature given DKA. Resolved with hydration

## 2022-09-03 NOTE — ED Triage Notes (Signed)
Pt is with his wife.  Pt c/o bilateral hip and leg pain, dizziness, weight loss x51monthSOB x2weeks  Pt slipped and fell 1 month ago and is now having pain on the left side.  Pt states that he lost 7 pounds in 1 week.

## 2022-09-03 NOTE — Progress Notes (Signed)
Critical lab pH 7.18 MD Made aware by RN. MD encouraged as pH is improving.

## 2022-09-03 NOTE — ED Provider Notes (Signed)
Patient presents for evaluation of shortness of breath at rest worsened by exertion for 2 week,  dizziness described as a sensation of feeling unbalanced, generalized weakness,  polydipsia and a 7 pound weight loss over the past week.  Has not been taking diabetes medication as directed, has run out of glipizide, only taking metformin, has upcoming PCP appointment this Tuesday, last A1c 11.8 in October 2023.  At home point-of-care sugars have been ranging in the 400s.  In office CBG 448 therefore he is being sent to the nearest emergency department for hyperglycemia and management of DKA.  Vital signs are stable and he is to be escorted by his wife.     Hans Eden, NP 09/03/22 1109

## 2022-09-03 NOTE — ED Notes (Signed)
RN attempted report to ICU, no answer.

## 2022-09-03 NOTE — ED Provider Notes (Signed)
[facility] Provider Note    Event Date/Time   First MD Initiated Contact with Patient 09/03/22 1229     (approximate)   History   Chief Complaint Hyperglycemia   HPI  Jake Leonard is a 62 y.o. male with past medical history of diabetes who presents to the ED complaining of hyperglycemia.  Patient reports that he ran out of his diabetic medications 1 week ago and has been feeling increasingly weak and malaised.  He reports that his blood sugars have been running in the 400s and he has begun feeling short of breath.  He denies any fevers, cough, or chest pain.  He has not had any abdominal pain, nausea, vomiting, diarrhea, or dysuria.     Physical Exam   Triage Vital Signs: ED Triage Vitals  Enc Vitals Group     BP 09/03/22 1129 (!) 148/92     Pulse Rate 09/03/22 1129 (!) 112     Resp 09/03/22 1129 18     Temp 09/03/22 1129 98.4 F (36.9 C)     Temp Source 09/03/22 1129 Oral     SpO2 09/03/22 1129 99 %     Weight --      Height --      Head Circumference --      Peak Flow --      Pain Score 09/03/22 1130 0     Pain Loc --      Pain Edu? --      Excl. in [place]? --     Most recent vital signs: Vitals:   09/03/22 1129  BP: (!) 148/92  Pulse: (!) 112  Resp: 18  Temp: 98.4 F (36.9 C)  SpO2: 99%    Constitutional: Alert and oriented. Eyes: Conjunctivae are normal. Head: Atraumatic. Nose: No congestion/rhinnorhea. Mouth/Throat: Mucous membranes are dry.  Cardiovascular: Normal rate, regular rhythm. Grossly normal heart sounds.  2+ radial pulses bilaterally. Respiratory: Normal respiratory effort.  No retractions. Lungs CTAB. Gastrointestinal: Soft and nontender. No distention. Musculoskeletal: No lower extremity tenderness nor edema.  Neurologic:  Normal speech and language. No gross focal neurologic deficits are appreciated.    ED Results / Procedures / Treatments   Labs (all labs ordered are listed, but only abnormal results  are displayed) Labs Reviewed  BASIC METABOLIC PANEL - Abnormal; Notable for the following components:      Result Value   Sodium 129 (*)    CO2 9 (*)    Glucose, Bld 485 (*)    BUN 27 (*)    Creatinine, Ser 1.84 (*)    GFR, Estimated 41 (*)    Anion gap 20 (*)    All other components within normal limits  CBC - Abnormal; Notable for the following components:   RBC 5.93 (*)    All other components within normal limits  URINALYSIS, ROUTINE W REFLEX MICROSCOPIC - Abnormal; Notable for the following components:   Color, Urine STRAW (*)    APPearance CLEAR (*)    Glucose, UA >=500 (*)    Hgb urine dipstick MODERATE (*)    Ketones, ur 80 (*)    Protein, ur 30 (*)    All other components within normal limits  BETA-HYDROXYBUTYRIC ACID - Abnormal; Notable for the following components:   Beta-Hydroxybutyric Acid >8.00 (*)    All other components within normal limits  CBG MONITORING, ED - Abnormal; Notable for the following components:   Glucose-Capillary 447 (*)    All other components within  normal limits  CBG MONITORING, ED - Abnormal; Notable for the following components:   Glucose-Capillary 424 (*)    All other components within normal limits  CBG MONITORING, ED - Abnormal; Notable for the following components:   Glucose-Capillary 390 (*)    All other components within normal limits  BLOOD GAS, VENOUS  BASIC METABOLIC PANEL  BASIC METABOLIC PANEL  BASIC METABOLIC PANEL  BASIC METABOLIC PANEL  BETA-HYDROXYBUTYRIC ACID  BETA-HYDROXYBUTYRIC ACID  CBG MONITORING, ED  TROPONIN I (HIGH SENSITIVITY)  TROPONIN I (HIGH SENSITIVITY)     EKG  ED ECG REPORT I, Blake Divine, the attending physician, personally viewed and interpreted this ECG.   Date: 09/03/2022  EKG Time: 11:39  Rate: 106  Rhythm: sinus tachycardia  Axis: Normal  Intervals:none  ST&T Change: Nonspecific T wave abnormality  RADIOLOGY Chest x-ray reviewed and interpreted by me with no infiltrate, edema, or  effusion.  PROCEDURES:  Critical Care performed: Yes, see critical care procedure note(s)  .Critical Care  Performed by: Blake Divine, MD Authorized by: Blake Divine, MD   Critical care provider statement:    Critical care time (minutes):  30   Critical care time was exclusive of:  Separately billable procedures and treating other patients and teaching time   Critical care was necessary to treat or prevent imminent or life-threatening deterioration of the following conditions:  Endocrine crisis and metabolic crisis   Critical care was time spent personally by me on the following activities:  Development of treatment plan with patient or surrogate, discussions with consultants, evaluation of patient's response to treatment, examination of patient, ordering and review of laboratory studies, ordering and review of radiographic studies, ordering and performing treatments and interventions, pulse oximetry, re-evaluation of patient's condition and review of old charts   I assumed direction of critical care for this patient from another provider in my specialty: no     Care discussed with: admitting provider      MEDICATIONS ORDERED IN ED: Medications  insulin regular, human (MYXREDLIN) 100 units/ 100 mL infusion (12 Units/hr Intravenous Rate/Dose Verify 09/03/22 1357)  lactated ringers infusion (has no administration in time range)  dextrose 5 % in lactated ringers infusion (has no administration in time range)  dextrose 50 % solution 0-50 mL (has no administration in time range)  sodium chloride 0.9 % bolus 1,000 mL (0 mLs Intravenous Stopped 09/03/22 1231)  lactated ringers bolus 1,000 mL (1,000 mLs Intravenous New Bag/Given 09/03/22 1246)     IMPRESSION / MDM / [place] / ED COURSE  I reviewed the triage vital signs and the nursing notes.                              62 y.o. male with past medical history of diabetes who presents to the ED complaining of weakness,  malaise, and shortness of breath increasing over the past couple of days after patient ran out of his diabetic medications.  Patient's presentation is most consistent with acute presentation with potential threat to life or bodily function.  Differential diagnosis includes, but is not limited to, DKA, hyperglycemia, dehydration, electrolyte abnormality, AKI, pneumonia, CHF, UTI.  Patient nontoxic-appearing and in no acute distress, vital signs remarkable for tachycardia but otherwise reassuring.  Labs concerning for DKA with hyperglycemia, increased anion gap, and acidosis.  He was given 2 L of IV fluid bolus, beta hydroxybutyrate greater than 8.0 consistent with DKA.  No significant anemia or  leukocytosis noted, patient does have mild AKI.  No evidence of infectious process as chest x-ray is clear and urinalysis with no signs of infection.  Suspect DKA is due to medication noncompliance, we will start patient on insulin drip and case discussed with hospitalist for admission.      FINAL CLINICAL IMPRESSION(S) / ED DIAGNOSES   Final diagnoses:  Diabetic ketoacidosis without coma associated with type 2 diabetes mellitus ([place])     Rx / DC Orders   ED Discharge Orders     None        Note:  This document was prepared using Dragon voice recognition software and may include unintentional dictation errors.   Blake Divine, MD 09/03/22 [id number]

## 2022-09-03 NOTE — Assessment & Plan Note (Addendum)
Patient presenting with elevated glucose, anion gap acidosis and elevated beta hydroxybutyrate acid in the setting of missed medications for 1 week due to insurance changes.  - DKA resolved with insulin drip - Transitioned to subcutaneous insulin and transferred to floor - A1c pending

## 2022-09-03 NOTE — Assessment & Plan Note (Signed)
Patient states he has bilateral, lateral thigh pain that seems to originate in his lower back after he fell onto his buttocks from a squatting position.  Likely muscular in nature however differential also includes radiculopathy.  -Supportive management at this time.

## 2022-09-03 NOTE — Discharge Instructions (Addendum)
In office today your blood sugar was 448 which is elevated enough to start to cause organ damage and I do think this is contributing to your weight loss, shortness of breath and dizziness  Please go to the nearest emergency department for your blood sugar to be safely brought down to a controlled level

## 2022-09-03 NOTE — ED Triage Notes (Signed)
Pt to ED via [facility] from [place]. Pt reports hyperglycemia and has not had medication x1 wk due to being out. Pt reports SOB/dizziness x2 days and approximately 7lb weight loss in the past week. Pt also reports he fell a month ago and has been having bilateral hip pain. Pt reports pain on left side has gotten worse. Pt ambulatory to triage room.    Pt CBG at UC 448. Pt CBG on arrival 447.

## 2022-09-04 DIAGNOSIS — E1165 Type 2 diabetes mellitus with hyperglycemia: Secondary | ICD-10-CM | POA: Diagnosis not present

## 2022-09-04 DIAGNOSIS — N179 Acute kidney failure, unspecified: Secondary | ICD-10-CM | POA: Diagnosis not present

## 2022-09-04 DIAGNOSIS — I10 Essential (primary) hypertension: Secondary | ICD-10-CM | POA: Diagnosis not present

## 2022-09-04 DIAGNOSIS — E1110 Type 2 diabetes mellitus with ketoacidosis without coma: Secondary | ICD-10-CM | POA: Diagnosis not present

## 2022-09-04 LAB — BASIC METABOLIC PANEL
Anion gap: 7 (ref 5–15)
Anion gap: 8 (ref 5–15)
BUN: 17 mg/dL (ref 8–23)
BUN: 14 mg/dL (ref 8–23)
CO2: 16 mmol/L — ABNORMAL LOW (ref 22–32)
CO2: 15 mmol/L — ABNORMAL LOW (ref 22–32)
Calcium: 8.8 mg/dL — ABNORMAL LOW (ref 8.9–10.3)
Calcium: 8.9 mg/dL (ref 8.9–10.3)
Chloride: 110 mmol/L (ref 98–111)
Chloride: 112 mmol/L — ABNORMAL HIGH (ref 98–111)
Creatinine, Ser: 1.14 mg/dL (ref 0.61–1.24)
Creatinine, Ser: 1.1 mg/dL (ref 0.61–1.24)
GFR, Estimated: >60 mL/min (ref >60)
GFR, Estimated: >60 mL/min (ref >60)
Glucose, Bld: 166 mg/dL — ABNORMAL HIGH (ref 70–99)
Glucose, Bld: 188 mg/dL — ABNORMAL HIGH (ref 70–99)
Potassium: 3.9 mmol/L (ref 3.5–5.1)
Potassium: 3.6 mmol/L (ref 3.5–5.1)
Sodium: 133 mmol/L — ABNORMAL LOW (ref 135–145)
Sodium: 135 mmol/L (ref 135–145)

## 2022-09-04 LAB — GLUCOSE, CAPILLARY
Glucose-Capillary: 141 mg/dL — ABNORMAL HIGH (ref 70–99)
Glucose-Capillary: 145 mg/dL — ABNORMAL HIGH (ref 70–99)
Glucose-Capillary: 147 mg/dL — ABNORMAL HIGH (ref 70–99)
Glucose-Capillary: 152 mg/dL — ABNORMAL HIGH (ref 70–99)
Glucose-Capillary: 166 mg/dL — ABNORMAL HIGH (ref 70–99)
Glucose-Capillary: 170 mg/dL — ABNORMAL HIGH (ref 70–99)
Glucose-Capillary: 190 mg/dL — ABNORMAL HIGH (ref 70–99)
Glucose-Capillary: 191 mg/dL — ABNORMAL HIGH (ref 70–99)
Glucose-Capillary: 243 mg/dL — ABNORMAL HIGH (ref 70–99)
Glucose-Capillary: 263 mg/dL — ABNORMAL HIGH (ref 70–99)
Glucose-Capillary: 325 mg/dL — ABNORMAL HIGH (ref 70–99)

## 2022-09-04 LAB — CBC WITH DIFFERENTIAL/PLATELET
Abs Immature Granulocytes: 0.01 10*3/uL (ref 0.00–0.07)
Basophils Absolute: 0 10*3/uL (ref 0.0–0.1)
Basophils Relative: 1 %
Eosinophils Absolute: 0.1 10*3/uL (ref 0.0–0.5)
Eosinophils Relative: 2 %
HCT: 38.8 % — ABNORMAL LOW (ref 39.0–52.0)
Hemoglobin: 13.6 g/dL (ref 13.0–17.0)
Immature Granulocytes: 0 %
Lymphocytes Relative: 54 %
Lymphs Abs: 2 10*3/uL (ref 0.7–4.0)
MCH: 27.7 pg (ref 26.0–34.0)
MCHC: 35.1 g/dL (ref 30.0–36.0)
MCV: 79 fL — ABNORMAL LOW (ref 80.0–100.0)
Monocytes Absolute: 0.4 10*3/uL (ref 0.1–1.0)
Monocytes Relative: 9 %
Neutro Abs: 1.3 10*3/uL — ABNORMAL LOW (ref 1.7–7.7)
Neutrophils Relative %: 34 %
Platelets: 194 10*3/uL (ref 150–400)
RBC: 4.91 MIL/uL (ref 4.22–5.81)
RDW: 12.8 % (ref 11.5–15.5)
WBC: 3.8 10*3/uL — ABNORMAL LOW (ref 4.0–10.5)
nRBC: 0 % (ref 0.0–0.2)

## 2022-09-04 LAB — BETA-HYDROXYBUTYRIC ACID: Beta-Hydroxybutyric Acid: 1.5 mmol/L — ABNORMAL HIGH (ref 0.05–0.27)

## 2022-09-04 LAB — HIV ANTIBODY (ROUTINE TESTING W REFLEX): HIV Screen 4th Generation wRfx: NONREACTIVE

## 2022-09-04 MED ORDER — INSULIN GLARGINE-YFGN 100 UNIT/ML ~~LOC~~ SOLN
10.0000 [IU] | Freq: Every day | SUBCUTANEOUS | Status: DC
  Administered 2022-09-04 – 2022-09-06 (×3): 10 [IU] via SUBCUTANEOUS
  Filled 2022-09-04 (×3): qty 0.1

## 2022-09-04 MED ORDER — INSULIN ASPART 100 UNIT/ML IJ SOLN
0.0000 [IU] | Freq: Three times a day (TID) | INTRAMUSCULAR | Status: DC
  Administered 2022-09-04: 5 [IU] via SUBCUTANEOUS
  Administered 2022-09-04: 7 [IU] via SUBCUTANEOUS
  Administered 2022-09-05 (×2): 3 [IU] via SUBCUTANEOUS
  Administered 2022-09-05: 5 [IU] via SUBCUTANEOUS
  Administered 2022-09-06: 2 [IU] via SUBCUTANEOUS
  Filled 2022-09-04 (×6): qty 1

## 2022-09-04 MED ORDER — INSULIN ASPART 100 UNIT/ML IJ SOLN
0.0000 [IU] | Freq: Every day | INTRAMUSCULAR | Status: DC
  Administered 2022-09-04: 2 [IU] via SUBCUTANEOUS
  Filled 2022-09-04: qty 1

## 2022-09-04 MED ORDER — INSULIN ASPART 100 UNIT/ML IJ SOLN
3.0000 [IU] | Freq: Three times a day (TID) | INTRAMUSCULAR | Status: DC
  Administered 2022-09-04 – 2022-09-06 (×6): 3 [IU] via SUBCUTANEOUS
  Filled 2022-09-04 (×6): qty 1

## 2022-09-04 NOTE — Progress Notes (Signed)
  Progress Note   Patient: Jake Leonard [id number] DOB: 1960-10-17 DOA: 09/03/2022     1 DOS: the patient was seen and examined on 09/04/2022   Brief hospital course: 62 y.o. male with medical history significant of type 2 diabetes, hypertension, hyperlipidemia, who presents to the ED due to elevated blood sugar and leg pain and admitted for DKA and AKI  2/17: Transitioned off insulin drip and transfer out to the floors  Assessment and Plan: * DKA (diabetic ketoacidosis) ([place]) Patient presenting with elevated glucose, anion gap acidosis and elevated beta hydroxybutyrate acid in the setting of missed medications for 1 week due to insurance changes.  - DKA resolved with insulin drip - Transitioned to subcutaneous insulin and transferred to floor - A1c pending  AKI (acute kidney injury) ([place]) Likely prerenal in nature given DKA. Resolved with hydration  Leg pain Patient states he has bilateral, lateral thigh pain that seems to originate in his lower back after he fell onto his buttocks from a squatting position.  Likely muscular in nature however differential also includes radiculopathy.  -Supportive management at this time.  Type 2 diabetes mellitus with hyperglycemia, without long-term current use of insulin (HCC) Patient uses Glucotrol 10 mg qd, Metformin 1 gm bid at home Last A1c 11.8 on 04/27/22.  A1c pending on this admission And has new PCP appointment on coming Tuesday and prefers to discuss with PCP for insulin  OSA (obstructive sleep apnea) - CPAP at bedtime  HTN (hypertension) - Hold home lisinopril in the setting of AKI.  Blood pressure is stable        Subjective: Feeling much better.  He would like to eat  Physical Exam: Vitals:   09/04/22 0500 09/04/22 0600 09/04/22 0700 09/04/22 0900  BP: 126/83 123/82 126/82 122/88  Pulse: 86 86 83   Resp:  16 12 18  $ Temp:    98.2 F (36.8 C)  TempSrc:    Oral  SpO2: 99% 99% 100% 100%  Weight:      Height:        62 year old male lying in the bed comfortably without any acute distress Lungs clear to auscultation bilaterally Heart regular rate and rhythm Abdomen soft, benign Neuro alert and awake, nonfocal Skin no rash or lesion Psych normal mood and affect Data Reviewed:  Blood sugar ranging from 140s to 200  Family Communication: Wife updated at bedside  Disposition: Status is: Inpatient Remains inpatient appropriate because: Management of diabetes and DKA  Planned Discharge Destination: Home   DVT prophylaxis-Lovenox Time spent: 35 minutes  Author: Max Sane, MD 09/04/2022 12:54 PM  For on call review [email or web address].

## 2022-09-04 NOTE — Hospital Course (Signed)
62 y.o. male with medical history significant of type 2 diabetes, hypertension, hyperlipidemia, who presents to the ED due to elevated blood sugar and leg pain and admitted for DKA and AKI  2/17: Transitioned off insulin drip and transfer out to the floors 2/18: Blood pressure running low, feeling tired

## 2022-09-04 NOTE — Assessment & Plan Note (Signed)
Patient uses Glucotrol 10 mg qd, Metformin 1 gm bid at home Last A1c 11.8 on 04/27/22.  A1c pending on this admission And has new PCP appointment on coming Tuesday and prefers to discuss with PCP for insulin

## 2022-09-05 DIAGNOSIS — E1165 Type 2 diabetes mellitus with hyperglycemia: Secondary | ICD-10-CM | POA: Diagnosis not present

## 2022-09-05 DIAGNOSIS — I10 Essential (primary) hypertension: Secondary | ICD-10-CM | POA: Diagnosis not present

## 2022-09-05 DIAGNOSIS — N179 Acute kidney failure, unspecified: Secondary | ICD-10-CM | POA: Diagnosis not present

## 2022-09-05 DIAGNOSIS — E1110 Type 2 diabetes mellitus with ketoacidosis without coma: Secondary | ICD-10-CM | POA: Diagnosis not present

## 2022-09-05 LAB — CBC
HCT: 40.6 % (ref 39.0–52.0)
Hemoglobin: 14.2 g/dL (ref 13.0–17.0)
MCH: 27.8 pg (ref 26.0–34.0)
MCHC: 35 g/dL (ref 30.0–36.0)
MCV: 79.6 fL — ABNORMAL LOW (ref 80.0–100.0)
Platelets: 204 10*3/uL (ref 150–400)
RBC: 5.1 MIL/uL (ref 4.22–5.81)
RDW: 12.8 % (ref 11.5–15.5)
WBC: 6.1 10*3/uL (ref 4.0–10.5)
nRBC: 0 % (ref 0.0–0.2)

## 2022-09-05 LAB — BASIC METABOLIC PANEL
Anion gap: 9 (ref 5–15)
BUN: 17 mg/dL (ref 8–23)
CO2: 19 mmol/L — ABNORMAL LOW (ref 22–32)
Calcium: 9.4 mg/dL (ref 8.9–10.3)
Chloride: 108 mmol/L (ref 98–111)
Creatinine, Ser: 1.27 mg/dL — ABNORMAL HIGH (ref 0.61–1.24)
GFR, Estimated: >60 mL/min (ref >60)
Glucose, Bld: 176 mg/dL — ABNORMAL HIGH (ref 70–99)
Potassium: 3.5 mmol/L (ref 3.5–5.1)
Sodium: 136 mmol/L (ref 135–145)

## 2022-09-05 LAB — GLUCOSE, CAPILLARY
Glucose-Capillary: 225 mg/dL — ABNORMAL HIGH (ref 70–99)
Glucose-Capillary: 289 mg/dL — ABNORMAL HIGH (ref 70–99)
Glucose-Capillary: 221 mg/dL — ABNORMAL HIGH (ref 70–99)
Glucose-Capillary: 157 mg/dL — ABNORMAL HIGH (ref 70–99)

## 2022-09-05 MED ORDER — GLIPIZIDE 10 MG PO TABS
10.0000 mg | ORAL_TABLET | Freq: Every day | ORAL | Status: DC
  Administered 2022-09-05 – 2022-09-06 (×2): 10 mg via ORAL
  Filled 2022-09-05 (×2): qty 1

## 2022-09-05 MED ORDER — SODIUM CHLORIDE 0.9 % IV SOLN: INTRAVENOUS | Status: AC

## 2022-09-05 NOTE — Assessment & Plan Note (Signed)
Likely prerenal in nature given DKA. Resolved with hydration

## 2022-09-05 NOTE — Progress Notes (Signed)
  Progress Note   Patient: Jake Leonard [id number] DOB: Nov 28, 1960 DOA: 09/03/2022     2 DOS: the patient was seen and examined on 09/05/2022   Brief hospital course: 62 y.o. male with medical history significant of type 2 diabetes, hypertension, hyperlipidemia, who presents to the ED due to elevated blood sugar and leg pain and admitted for DKA and AKI  2/17: Transitioned off insulin drip and transfer out to the floors 2/18: Blood pressure running low, feeling tired  Assessment and Plan: * DKA (diabetic ketoacidosis) ([place]) Patient presenting with elevated glucose, anion gap acidosis and elevated beta hydroxybutyrate acid in the setting of missed medications for 1 week due to insurance changes.  - DKA resolved with insulin drip - Continue insulin Semglee, mealtime coverage and sliding scale.  Resume glipizide and hold metformin - A1c pending  AKI (acute kidney injury) ([place]) Likely prerenal in nature given DKA. Resolved with hydration  Leg pain Patient states he has bilateral, lateral thigh pain that seems to originate in his lower back after he fell onto his buttocks from a squatting position.  Likely muscular in nature however differential also includes radiculopathy.  -Supportive management at this time.  Type 2 diabetes mellitus with hyperglycemia, without long-term current use of insulin (HCC) Resume Glucotrol 10 mg qd, hold metformin 1 gm bid at home Last A1c 11.8 on 04/27/22.  A1c pending on this admission And has new PCP appointment on coming Tuesday and prefers to discuss with PCP for insulin Continue insulin sliding scale, Semglee and mealtime coverage  OSA (obstructive sleep apnea) - CPAP at bedtime  HTN (hypertension) - Hold home lisinopril in the setting of AKI.  Blood pressure is running low        Subjective: Feeling tired and sleepy.  Blood pressure running low  Physical Exam: Vitals:   09/04/22 1512 09/04/22 1915 09/05/22 0415 09/05/22 0742  BP:  (!) 147/120 113/74 104/71 98/68  Pulse: 96 78 93 92  Resp: 18 16 16 18  $ Temp: 98.8 F (37.1 C) 98 F (36.7 C) (!) 97.5 F (36.4 C) 98 F (36.7 C)  TempSrc:  Oral Oral Oral  SpO2: 100% 97% 99% 100%  Weight:      Height:       62 year old male lying in the bed comfortably without any acute distress Lungs clear to auscultation bilaterally Heart regular rate and rhythm Abdomen soft, benign Neuro alert and awake, nonfocal Skin no rash or lesion Psych normal mood and affect Data Reviewed:  There are no new results to review at this time.  Family Communication: None at bedside  Disposition: Status is: Inpatient Remains inpatient appropriate because: Management of blood sugar and low blood pressure  Planned Discharge Destination: Home   DVT prophylaxis-Lovenox Time spent: 35 minutes  Author: Max Sane, MD 09/05/2022 1:16 PM  For on call review [email or web address].

## 2022-09-05 NOTE — Assessment & Plan Note (Signed)
-   Hold home lisinopril in the setting of AKI.  Blood pressure is running low

## 2022-09-05 NOTE — Assessment & Plan Note (Signed)
Patient presenting with elevated glucose, anion gap acidosis and elevated beta hydroxybutyrate acid in the setting of missed medications for 1 week due to insurance changes.  - DKA resolved with insulin drip - Continue insulin Semglee, mealtime coverage and sliding scale.  Resume glipizide and hold metformin - A1c pending

## 2022-09-05 NOTE — Assessment & Plan Note (Signed)
Patient states he has bilateral, lateral thigh pain that seems to originate in his lower back after he fell onto his buttocks from a squatting position.  Likely muscular in nature however differential also includes radiculopathy.  -Supportive management at this time.

## 2022-09-05 NOTE — Plan of Care (Signed)

## 2022-09-05 NOTE — Assessment & Plan Note (Signed)
-   CPAP at bedtime 

## 2022-09-05 NOTE — Plan of Care (Signed)

## 2022-09-05 NOTE — Assessment & Plan Note (Signed)
Resume Glucotrol 10 mg qd, hold metformin 1 gm bid at home Last A1c 11.8 on 04/27/22.  A1c pending on this admission And has new PCP appointment on coming Tuesday and prefers to discuss with PCP for insulin Continue insulin sliding scale, Semglee and mealtime coverage

## 2022-09-06 DIAGNOSIS — E1165 Type 2 diabetes mellitus with hyperglycemia: Secondary | ICD-10-CM | POA: Diagnosis not present

## 2022-09-06 DIAGNOSIS — N179 Acute kidney failure, unspecified: Secondary | ICD-10-CM | POA: Diagnosis not present

## 2022-09-06 DIAGNOSIS — G4733 Obstructive sleep apnea (adult) (pediatric): Secondary | ICD-10-CM | POA: Diagnosis not present

## 2022-09-06 DIAGNOSIS — E1110 Type 2 diabetes mellitus with ketoacidosis without coma: Secondary | ICD-10-CM | POA: Diagnosis not present

## 2022-09-06 LAB — BASIC METABOLIC PANEL
Anion gap: 11 (ref 5–15)
BUN: 17 mg/dL (ref 8–23)
CO2: 18 mmol/L — ABNORMAL LOW (ref 22–32)
Calcium: 8.8 mg/dL — ABNORMAL LOW (ref 8.9–10.3)
Chloride: 106 mmol/L (ref 98–111)
Creatinine, Ser: 1.29 mg/dL — ABNORMAL HIGH (ref 0.61–1.24)
GFR, Estimated: >60 mL/min (ref >60)
Glucose, Bld: 245 mg/dL — ABNORMAL HIGH (ref 70–99)
Potassium: 3.5 mmol/L (ref 3.5–5.1)
Sodium: 135 mmol/L (ref 135–145)

## 2022-09-06 LAB — HEMOGLOBIN A1C
Hgb A1c MFr Bld: >15.5 % — ABNORMAL HIGH (ref 4.8–5.6)
Mean Plasma Glucose: >398 mg/dL

## 2022-09-06 LAB — GLUCOSE, CAPILLARY: Glucose-Capillary: 172 mg/dL — ABNORMAL HIGH (ref 70–99)

## 2022-09-06 MED ORDER — GLIPIZIDE 10 MG PO TABS: 10.0000 mg | ORAL_TABLET | Freq: Every day | ORAL | 0 refills | Status: AC

## 2022-09-06 MED ORDER — EMPAGLIFLOZIN 25 MG PO TABS: 25.0000 mg | ORAL_TABLET | Freq: Every day | ORAL | 0 refills | Status: AC

## 2022-09-06 MED ORDER — PIOGLITAZONE HCL 45 MG PO TABS: 45.0000 mg | ORAL_TABLET | Freq: Every day | ORAL | 0 refills | Status: AC

## 2022-09-06 NOTE — TOC Initial Note (Signed)
Transition of Care [facility]) - Initial/Assessment Note    Patient Details  Name: Jake Leonard MRN: [id number] Date of Birth: June 09, 1961  Transition of Care [facility]) CM/SW Contact:    Beverly Sessions, RN Phone Number: 09/06/2022, 10:16 AM  Clinical Narrative:                     Transition of Care [facility]) Screening Note   Patient Details  Name: Jake Leonard Date of Birth: [birth year]   Transition of Care [facility]) CM/SW Contact:    Beverly Sessions, RN Phone Number: 09/06/2022, 10:16 AM    Transition of Care Department [facility]) has reviewed patient and no TOC needs have been identified at this time. We will continue to monitor patient advancement through interdisciplinary progression rounds. If new patient transition needs arise, please place a TOC consult.       Patient Goals and CMS Choice            Expected Discharge Plan and Services         Expected Discharge Date: 09/06/22                                    Prior Living Arrangements/Services                       Activities of Daily Living Home Assistive Devices/Equipment: None ADL Screening (condition at time of admission) Patient's cognitive ability adequate to safely complete daily activities?: Yes Is the patient deaf or have difficulty hearing?: No Does the patient have difficulty seeing, even when wearing glasses/contacts?: No Does the patient have difficulty concentrating, remembering, or making decisions?: No Patient able to express need for assistance with ADLs?: Yes Does the patient have difficulty dressing or bathing?: No Independently performs ADLs?: Yes (appropriate for developmental age) Does the patient have difficulty walking or climbing stairs?: No Weakness of Legs: None Weakness of Arms/Hands: None  Permission Sought/Granted                  Emotional Assessment              Admission diagnosis:  DKA (diabetic ketoacidosis) ([place]) [E11.10] Diabetic  ketoacidosis without coma associated with type 2 diabetes mellitus ([place]) [E11.10] Patient Active Problem List   Diagnosis Date Noted   DKA (diabetic ketoacidosis) ([place]) 09/03/2022   Leg pain 09/03/2022   AKI (acute kidney injury) ([place]) 09/03/2022   Type 2 diabetes mellitus with hyperglycemia, without long-term current use of insulin ([place]) 07/18/2018   Neutropenia ([place]) 05/30/2016   Anemia, mild 10/21/2015   OSA (obstructive sleep apnea) 10/21/2015   Allergic rhinitis 10/25/2013   Asthma 10/25/2013   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 [id number]: [phone]: [phone]    Social Determinants of Health (SDOH) Social History: SDOH Screenings   Food Insecurity: No Food Insecurity (09/03/2022)  Housing: Low Risk  (09/03/2022)  Transportation Needs: No Transportation Needs (09/03/2022)  Utilities: Not At Risk (09/03/2022)  Tobacco Use: Low Risk  (09/03/2022)   SDOH Interventions:     Readmission Risk Interventions     No data to display

## 2022-09-06 NOTE — Inpatient Diabetes Management (Signed)
Inpatient Diabetes Program Recommendations  AACE/ADA: New Consensus Statement on Inpatient Glycemic Control   Target Ranges:  Prepandial:   less than 140 mg/dL      Peak postprandial:   less than 180 mg/dL (1-2 hours)      Critically ill patients:  140 - 180 mg/dL    Latest Reference Range & Units 09/05/22 08:08 09/05/22 11:05 09/05/22 16:26 09/05/22 21:02 09/06/22 08:05  Glucose-Capillary 70 - 99 mg/dL 225 (H) 289 (H) 221 (H) 157 (H) 172 (H)   Review of Glycemic Control  Diabetes history: DM2 Outpatient Diabetes medications: Jardiance 25 mg daily, Glipizide 10 mg QAM, Actos 45 mg daily, Metformin 1000 mg BID, and Mounjaro 2.5 mg Qweek (not taking) Current orders for Inpatient glycemic control: Semglee 10 units daily, Novolog 3 units TID with meals, Novolog 0-9 units TID with meals, Novolog 0-5 units QHS, Glipizide 10 mg QAM  Inpatient Diabetes Program Recommendations:    HbgA1C: Current A1C in process. Noted A1C in care everywhere of 11.8% on 04/27/22.   Outpatient DM: Patient requested Rx for Glipizide, Jardiance, and Actos at discharge.  Patient plans to talk with PCP about possibly using insulin at his visit tomorrow on 09/07/22.  NOTE: Patient admitted on 09/03/22 with DKA, AKI, and leg pain. Per H&P on 09/03/22, patient reported that "approximately 1 week ago, he ran out of his diabetes medicine and due to a recent change in insurance, he was not able to pick up his new prescriptions. He has noticed through the week that his sugar has gradually risen."  Noted patient to be discharged today. Called patient and spoke with him over the phone. Patient states that he ran out of DM medications and he only has Metformin at home which he is not taking because he feels it does not work for him at all. Patient states he was taking  Jardiance 25 mg daily, Glipizide 10 mg QAM, Actos 45 mg daily until he ran out. Patient states that the Vania Rea was very expensive with prior insurance but he now has  [employer] and [employer] and is not sure what his insurance coverage for DM meds will be yet. Patient states he is not taking Mounjaro.  Patient reports that he has testing supplies at home for glucose monitoring. 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  Discussed his prior A1C of 11.8%. Informed patient that his current A1C is still in process.  Encouraged him to check MyChart for A1C results.  Discussed glucose and A1C goals. Discussed importance of checking CBGs and maintaining good CBG control to prevent long-term and short-term complications. Explained how hyperglycemia leads to damage within blood vessels which lead to the common complications seen with uncontrolled diabetes. Stressed to the patient the importance of improving glycemic control to prevent further complications from uncontrolled diabetes. Discussed progression of DM2 and that he may benefit from taking insulin at this point which could improve DM control and decrease risk of further complications from uncontrolled DM.  At discharge, patient would like to get Rx for Glipizide, Jardiance[facility] [place] He will discuss possibly using insulin with PCP tomorrow. Patient verbalized understanding of information discussed.   Thanks, Barnie Alderman, RN, MSN, [place] Diabetes Coordinator Inpatient Diabetes Program [phone] (Team Pager from 8am to [place])

## 2022-09-06 NOTE — Discharge Summary (Signed)
Physician Discharge Summary   Patient: Jake Leonard MRN: [id number] DOB: Dec 07, 1960  Admit date:     09/03/2022  Discharge date: 09/06/22  Discharge Physician: Max Sane   PCP: Bubba Camp, FNP   Recommendations at discharge:   Follow-up with outpatient providers as requested  Discharge Diagnoses: Principal Problem:   DKA (diabetic ketoacidosis) ([place]) Active Problems:   HTN (hypertension)   OSA (obstructive sleep apnea)   Type 2 diabetes mellitus with hyperglycemia, without long-term current use of insulin ([facility])   Leg pain   AKI (acute kidney injury) [facility])  Hospital Course: 62 y.o. male with medical history significant of type 2 diabetes, hypertension, hyperlipidemia, who presents to the ED due to elevated blood sugar and leg pain and admitted for DKA and AKI  2/17: Transitioned off insulin drip and transfer out to the floors 2/18: Blood pressure running low, feeling tired  Assessment and Plan: * DKA (diabetic ketoacidosis) ([place]) Type 2 diabetes mellitus with hyperglycemia, without long-term current use of insulin ([place]) Patient presenting with elevated glucose, anion gap acidosis and elevated beta hydroxybutyrate acid in the setting of missed medications for 1 week due to insurance changes.  - DKA resolved with insulin drip - A1c was 11.8 on 04/27/2022 -Patient has new PCP appointment tomorrow at 2 PM when he would like to discuss about insulin before starting as he has not taken insulin before -Absent prescription for glipizide, Jardiance and Actos at [facility] and may have been. -Patient is not taking metformin or Mounjaro  AKI (acute kidney injury) ([place]) Likely prerenal in nature given DKA. Resolved with hydration  Leg pain Patient states he has bilateral, lateral thigh pain that seems to originate in his lower back after he fell onto his buttocks from a squatting position.  Likely muscular in nature however differential also includes radiculopathy.  OSA  (obstructive sleep apnea) - CPAP at bedtime  HTN (hypertension) - Well-controlled at this time, consider stopping lisinopril as an outpatient if blood pressure remains stable         Disposition: Home Diet recommendation:  Discharge Diet Orders (From admission, onward)     Start     Ordered   09/06/22 0000  Diet - low sodium heart healthy        09/06/22 0813           Carb modified diet DISCHARGE MEDICATION: Allergies as of 09/06/2022   No Known Allergies      Medication List     STOP taking these medications    metFORMIN 1000 MG tablet Commonly known as: GLUCOPHAGE   Mounjaro 2.5 MG/0.5ML Pen Generic drug: tirzepatide       TAKE these medications    empagliflozin 25 MG Tabs tablet Commonly known as: JARDIANCE Take 1 tablet (25 mg total) by mouth daily.   glipiZIDE 10 MG tablet Commonly known as: GLUCOTROL Take 1 tablet (10 mg total) by mouth daily before breakfast.   lisinopril 10 MG tablet Commonly known as: ZESTRIL Take 1 tablet by mouth daily.   ondansetron 4 MG disintegrating tablet Commonly known as: Zofran ODT Take 1 tablet (4 mg total) by mouth every 8 (eight) hours as needed for nausea or vomiting.   pioglitazone 45 MG tablet Commonly known as: ACTOS Take 1 tablet (45 mg total) by mouth daily. What changed:  how much to take when to take this   simvastatin 20 MG tablet Commonly known as: ZOCOR Take 20 mg by mouth daily.  Follow-up Information     Bubba Camp, [place]. Go on 09/07/2022.   Specialty: Family Medicine Why: @  2 pm as scheduled Contact information: [address] [place] [address] [phone]                Discharge Exam: Danley Danker Weights   09/03/22 1500  Weight: 60 kg   62 year old male lying in the bed comfortably without any acute distress Lungs clear to auscultation bilaterally Heart regular rate and rhythm Abdomen soft, benign Neuro alert and awake, nonfocal Skin no rash or  lesion Psych normal mood and affect  Condition at discharge: good  The results of significant diagnostics from this hospitalization (including imaging, microbiology, ancillary and laboratory) are listed below for reference.   Imaging Studies: DG Chest 2 View  Result Date: 09/03/2022 CLINICAL DATA:  Weakness. EXAM: CHEST - 2 VIEW COMPARISON:  Chest x-ray dated March 17, 2019. FINDINGS: The heart size and mediastinal contours are within normal limits. Both lungs are clear. The visualized skeletal structures are unremarkable. IMPRESSION: No active cardiopulmonary disease. Electronically Signed   By: Titus Dubin M.D.   On: 09/03/2022 13:10    Microbiology: Results for orders placed or performed during the hospital encounter of 09/03/22  MRSA Next Gen by PCR, Nasal     Status: None   Collection Time: 09/03/22  2:46 PM   Specimen: Nasal Mucosa; Nasal Swab  Result Value Ref Range Status   MRSA by PCR Next Gen NOT DETECTED NOT DETECTED Final    Comment: (NOTE) The GeneXpert MRSA Assay (FDA approved for NASAL specimens only), is one component of a comprehensive MRSA colonization surveillance program. It is not intended to diagnose MRSA infection nor to guide or monitor treatment for MRSA infections. Test performance is not FDA approved in patients less than 42 years old. Performed at [facility], [address] [place], [place] [address]     Labs: CBC: Recent Labs  Lab 09/03/22 1132 09/04/22 0532 09/05/22 0433  WBC 8.6 3.8* 6.1  NEUTROABS  --  1.3*  --   HGB 16.5 13.6 14.2  HCT 48.3 38.8* 40.6  MCV 81.5 79.0* 79.6*  PLT 258 194 0000000   Basic Metabolic Panel: Recent Labs  Lab 09/03/22 1840 09/04/22 0052 09/04/22 0532 09/05/22 0433 09/06/22 0919  NA 133* 133* 135 136 135  K 3.8 3.9 3.6 3.5 3.5  CL 105 110 112* 108 106  CO2 12* 16* 15* 19* 18*  GLUCOSE 199* 166* 188* 176* 245*  BUN 21 17 14 17 17  $ CREATININE 1.48* 1.14 1.10 1.27* 1.29*  CALCIUM 9.0 8.8*  8.9 9.4 8.8*   Liver Function Tests: No results for input(s): "AST", "ALT", "ALKPHOS", "BILITOT", "PROT", "ALBUMIN" in the last 168 hours. CBG: Recent Labs  Lab 09/05/22 0808 09/05/22 1105 09/05/22 1626 09/05/22 2102 09/06/22 0805  GLUCAP 225* 289* 221* 157* 172*    Discharge time spent: greater than 30 minutes.  Signed: Max Sane, MD [facility] 09/06/2022

## 2022-09-07 LAB — HEMOGLOBIN A1C
Hgb A1c MFr Bld: >15.5 % — ABNORMAL HIGH (ref 4.8–5.6)
Mean Plasma Glucose: >398 mg/dL
